# Patient Record
Sex: Male | Born: 1950
Health system: Southern US, Community
[De-identification: ages and names within clinical notes are randomized; demographics above are authoritative.]

## PROBLEM LIST (undated history)

## (undated) DIAGNOSIS — Z889 Allergy status to unspecified drugs, medicaments and biological substances status: Secondary | ICD-10-CM

## (undated) DIAGNOSIS — E119 Type 2 diabetes mellitus without complications: Secondary | ICD-10-CM

## (undated) DIAGNOSIS — R931 Abnormal findings on diagnostic imaging of heart and coronary circulation: Secondary | ICD-10-CM

## (undated) DIAGNOSIS — E785 Hyperlipidemia, unspecified: Secondary | ICD-10-CM

## (undated) DIAGNOSIS — K429 Umbilical hernia without obstruction or gangrene: Secondary | ICD-10-CM

## (undated) DIAGNOSIS — C801 Malignant (primary) neoplasm, unspecified: Secondary | ICD-10-CM

## (undated) DIAGNOSIS — R053 Chronic cough: Secondary | ICD-10-CM

## (undated) DIAGNOSIS — J45909 Unspecified asthma, uncomplicated: Secondary | ICD-10-CM

## (undated) DIAGNOSIS — R05 Cough: Secondary | ICD-10-CM

## (undated) DIAGNOSIS — K4090 Unilateral inguinal hernia, without obstruction or gangrene, not specified as recurrent: Secondary | ICD-10-CM

## (undated) HISTORY — DX: Unilateral inguinal hernia, without obstruction or gangrene, not specified as recurrent: K40.90

## (undated) HISTORY — PX: INGUINAL HERNIA REPAIR: SUR1180

## (undated) HISTORY — DX: Abnormal findings on diagnostic imaging of heart and coronary circulation: R93.1

## (undated) HISTORY — DX: Type 2 diabetes mellitus without complications: E11.9

## (undated) HISTORY — PX: ROBOT ASSISTED LAPAROSCOPIC RADICAL PROSTATECTOMY: SHX5141

## (undated) HISTORY — PX: APPENDECTOMY: SHX54

## (undated) HISTORY — PX: TONSILLECTOMY: SUR1361

## (undated) HISTORY — DX: Umbilical hernia without obstruction or gangrene: K42.9

## (undated) HISTORY — DX: Chronic cough: R05.3

## (undated) HISTORY — DX: Hyperlipidemia, unspecified: E78.5

## (undated) HISTORY — DX: Cough: R05

## (undated) HISTORY — PX: OTHER SURGICAL HISTORY: SHX169

## (undated) HISTORY — DX: Malignant (primary) neoplasm, unspecified: C80.1

---

## 2003-03-31 ENCOUNTER — Other Ambulatory Visit: Payer: Self-pay

## 2005-11-09 ENCOUNTER — Encounter: Payer: Self-pay | Admitting: Pulmonary Disease

## 2006-09-09 ENCOUNTER — Emergency Department (HOSPITAL_COMMUNITY): Admission: EM | Admit: 2006-09-09 | Discharge: 2006-09-09 | Payer: Self-pay | Admitting: Emergency Medicine

## 2006-10-10 ENCOUNTER — Encounter: Payer: Self-pay | Admitting: Pulmonary Disease

## 2006-11-03 ENCOUNTER — Encounter: Payer: Self-pay | Admitting: Pulmonary Disease

## 2006-11-14 ENCOUNTER — Encounter: Payer: Self-pay | Admitting: Pulmonary Disease

## 2007-02-01 ENCOUNTER — Encounter: Payer: Self-pay | Admitting: Pulmonary Disease

## 2007-08-28 ENCOUNTER — Encounter: Payer: Self-pay | Admitting: Pulmonary Disease

## 2007-12-06 ENCOUNTER — Encounter: Payer: Self-pay | Admitting: Pulmonary Disease

## 2008-06-23 ENCOUNTER — Ambulatory Visit: Payer: Self-pay | Admitting: Pulmonary Disease

## 2008-06-23 DIAGNOSIS — R0602 Shortness of breath: Secondary | ICD-10-CM | POA: Insufficient documentation

## 2008-06-23 DIAGNOSIS — E785 Hyperlipidemia, unspecified: Secondary | ICD-10-CM | POA: Insufficient documentation

## 2008-06-23 DIAGNOSIS — J309 Allergic rhinitis, unspecified: Secondary | ICD-10-CM | POA: Insufficient documentation

## 2008-07-17 ENCOUNTER — Ambulatory Visit: Payer: Self-pay | Admitting: Pulmonary Disease

## 2008-08-06 ENCOUNTER — Ambulatory Visit: Payer: Self-pay | Admitting: Pulmonary Disease

## 2009-03-02 ENCOUNTER — Ambulatory Visit: Payer: Self-pay | Admitting: Gastroenterology

## 2009-07-13 ENCOUNTER — Emergency Department (HOSPITAL_COMMUNITY): Admission: EM | Admit: 2009-07-13 | Discharge: 2009-07-13 | Payer: Self-pay | Admitting: Family Medicine

## 2010-08-27 ENCOUNTER — Ambulatory Visit: Payer: Self-pay | Admitting: Urology

## 2010-08-31 ENCOUNTER — Emergency Department: Payer: Self-pay | Admitting: Emergency Medicine

## 2010-09-17 ENCOUNTER — Ambulatory Visit: Payer: Self-pay | Admitting: Surgery

## 2011-10-04 ENCOUNTER — Ambulatory Visit: Payer: Self-pay | Admitting: Surgery

## 2011-10-04 LAB — BASIC METABOLIC PANEL
Creatinine: 0.93 mg/dL (ref 0.60–1.30)
EGFR (African American): >60
Glucose: 86 mg/dL (ref 65–99)
Potassium: 4.2 mmol/L (ref 3.5–5.1)
Sodium: 142 mmol/L (ref 136–145)

## 2011-10-04 LAB — CBC WITH DIFFERENTIAL/PLATELET
Basophil %: 0.9 %
Eosinophil #: 0.1 10*3/uL (ref 0.0–0.7)
Eosinophil %: 1.1 %
HGB: 14.7 g/dL (ref 13.0–18.0)
Lymphocyte %: 19.5 %
Neutrophil %: 67.9 %
RBC: 4.95 10*6/uL (ref 4.40–5.90)

## 2012-01-19 ENCOUNTER — Ambulatory Visit: Payer: Self-pay | Admitting: Surgery

## 2012-01-19 LAB — CBC WITH DIFFERENTIAL/PLATELET
Basophil %: 0.6 %
HGB: 15.7 g/dL (ref 13.0–18.0)
Lymphocyte #: 1.2 10*3/uL (ref 1.0–3.6)
MCH: 30.1 pg (ref 26.0–34.0)
MCHC: 34.4 g/dL (ref 32.0–36.0)
Monocyte #: 1 x10 3/mm (ref 0.2–1.0)
Monocyte %: 16.4 %
Neutrophil #: 3.9 10*3/uL (ref 1.4–6.5)
Neutrophil %: 62 %
RBC: 5.21 10*6/uL (ref 4.40–5.90)
RDW: 13.5 % (ref 11.5–14.5)

## 2012-01-19 LAB — BASIC METABOLIC PANEL
Anion Gap: 8 (ref 7–16)
BUN: 18 mg/dL (ref 7–18)
Calcium, Total: 9.4 mg/dL (ref 8.5–10.1)
Co2: 26 mmol/L (ref 21–32)
Creatinine: 0.98 mg/dL (ref 0.60–1.30)
Glucose: 100 mg/dL — ABNORMAL HIGH (ref 65–99)

## 2012-01-25 ENCOUNTER — Ambulatory Visit: Payer: Self-pay | Admitting: Surgery

## 2013-02-15 ENCOUNTER — Encounter (INDEPENDENT_AMBULATORY_CARE_PROVIDER_SITE_OTHER): Payer: Self-pay

## 2013-02-15 ENCOUNTER — Encounter: Payer: Self-pay | Admitting: Cardiovascular Disease

## 2013-02-15 ENCOUNTER — Ambulatory Visit (INDEPENDENT_AMBULATORY_CARE_PROVIDER_SITE_OTHER): Payer: BC Managed Care – PPO | Admitting: Cardiovascular Disease

## 2013-02-15 VITALS — BP 130/84 | HR 95 | Ht 71.0 in | Wt 233.0 lb

## 2013-02-15 DIAGNOSIS — R079 Chest pain, unspecified: Secondary | ICD-10-CM

## 2013-02-15 NOTE — Progress Notes (Signed)
History of Present Illness: 63 yo male with history of BPH referred today as a new patient for evaluation of chest pain. He has recently been seen in primary care by Dr. Carol Ada. No prior cardiac issues. He describes chest pain that began three weeks ago. He had been painting the day before and then loaded the bikes on the car to go to the beach. He had central chest pressure that lasted for several minutes then resolved. He had recurrence of chest pain that night associated with diaphoresis and dyspnea. The pain lasted for 15-20 minutes. In the last three weeks he has had some tightness in the chest but just mild.  He has a chronic cough which typically flares in the fall of the year. He has seen an allergist and a pulmonologist in [place]. Felt to be allergy related. He had a stress test in  4-5 years  Primary Care Physician: Carol Ada   Past Medical History  Diagnosis Date  . Hyperlipidemia   . Umbilical hernia   . Inguinal hernia   . Chronic cough     Past Surgical History  Procedure Laterality Date  . Inguinal hernia repair Bilateral   . Appendectomy    . Tonsillectomy    . Umbilicial hernia repair      Current Outpatient Prescriptions  Medication Sig Dispense Refill  . cetirizine (ZYRTEC) 5 MG tablet Take 5 mg by mouth daily.      . fluticasone-salmeterol (ADVAIR HFA) 115-21 MCG/ACT inhaler Inhale 2 puffs into the lungs 2 (two) times daily.       No current facility-administered medications for this visit.    Allergies  Allergen Reactions  . Codeine     History   Social History  . Marital Status: Married    Spouse Name: N/A    Number of Children: 2  . Years of Education: N/A   Occupational History  . 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 Smoking status: Never Smoker   . Smokeless tobacco: Not on file  . Alcohol Use: No  . Drug Use: No  . Sexual Activity: Not on file   Other Topics Concern  . Not on file    Social History Narrative  . No narrative on file    Family History  Problem Relation Age of Onset  . CAD Father     CABG with redo  . Heart attack Paternal Grandfather     Review of Systems:  As stated in the HPI and otherwise negative.   BP 130/84  Pulse 95  Ht 5\' 11"  (1.803 m)  Wt 233 lb (105.688 kg)  BMI 32.51 kg/m2  Physical Examination: General: Well developed, well nourished, NAD HEENT: OP clear, mucus membranes moist SKIN: warm, dry. No rashes. Neuro: No focal deficits Musculoskeletal: Muscle strength 5/5 all ext Psychiatric: Mood and affect normal Neck: No JVD, no carotid bruits, no thyromegaly, no lymphadenopathy. Lungs:Clear bilaterally, no wheezes, rhonci, crackles Cardiovascular: Regular rate and rhythm. No murmurs, gallops or rubs. Abdomen:Soft. Bowel sounds present. Non-tender.  Extremities: No lower extremity edema. Pulses are 2 + in the bilateral DP/PT.  EKG: NSR, rate 95 bpm.   Assessment and Plan:   1. Chest pain: Risk factors for CAD include hyperlipidemia and FH of CAD. He has had several recent episodes of chest pain worrisome for unstable angina. Will arrange exercise stress myoview to exclude ischemia. Will arrange echo to assess LVEF, exclude structural heart disease. I  will see him back in several weeks to review.

## 2013-02-15 NOTE — Patient Instructions (Addendum)
Your physician recommends that you schedule a follow-up appointment in:  4-5 weeks  Your physician has requested that you have an echocardiogram. Echocardiography is a painless test that uses sound waves to create images of your heart. It provides your doctor with information about the size and shape of your heart and how well your heart's chambers and valves are working. This procedure takes approximately one hour. There are no restrictions for this procedure.   Your physician has requested that you have an exercise stress myoview. For further information please visit [email or web address]. Please follow instruction sheet, as given.  

## 2013-02-18 ENCOUNTER — Ambulatory Visit (HOSPITAL_COMMUNITY): Payer: BC Managed Care – PPO | Attending: Cardiovascular Disease | Admitting: Radiology

## 2013-02-18 DIAGNOSIS — R072 Precordial pain: Secondary | ICD-10-CM

## 2013-02-18 DIAGNOSIS — R0602 Shortness of breath: Secondary | ICD-10-CM | POA: Insufficient documentation

## 2013-02-18 DIAGNOSIS — E785 Hyperlipidemia, unspecified: Secondary | ICD-10-CM | POA: Insufficient documentation

## 2013-02-18 DIAGNOSIS — R079 Chest pain, unspecified: Secondary | ICD-10-CM

## 2013-02-18 NOTE — Progress Notes (Signed)
Echocardiogram performed.  

## 2013-02-20 ENCOUNTER — Telehealth: Payer: Self-pay | Admitting: Cardiovascular Disease

## 2013-02-20 NOTE — Telephone Encounter (Signed)
Spoke with pt and reviewed echo results with him.  

## 2013-02-20 NOTE — Telephone Encounter (Signed)
New message    Patient calling back to recent phone call from nurse.

## 2013-03-05 ENCOUNTER — Encounter: Payer: Self-pay | Admitting: Cardiology

## 2013-03-05 ENCOUNTER — Ambulatory Visit (HOSPITAL_COMMUNITY): Payer: BC Managed Care – PPO | Attending: Internal Medicine | Admitting: Radiology

## 2013-03-05 VITALS — BP 127/78 | Ht 71.0 in | Wt 231.0 lb

## 2013-03-05 DIAGNOSIS — R0602 Shortness of breath: Secondary | ICD-10-CM | POA: Insufficient documentation

## 2013-03-05 DIAGNOSIS — Z8249 Family history of ischemic heart disease and other diseases of the circulatory system: Secondary | ICD-10-CM | POA: Insufficient documentation

## 2013-03-05 DIAGNOSIS — E785 Hyperlipidemia, unspecified: Secondary | ICD-10-CM | POA: Insufficient documentation

## 2013-03-05 DIAGNOSIS — R079 Chest pain, unspecified: Secondary | ICD-10-CM | POA: Insufficient documentation

## 2013-03-05 MED ORDER — TECHNETIUM TC 99M SESTAMIBI GENERIC - CARDIOLITE
30.0000 | Freq: Once | INTRAVENOUS | Status: AC | PRN
  Administered 2013-03-05: 30 via INTRAVENOUS

## 2013-03-05 MED ORDER — TECHNETIUM TC 99M SESTAMIBI GENERIC - CARDIOLITE
10.0000 | Freq: Once | INTRAVENOUS | Status: AC | PRN
  Administered 2013-03-05: 10 via INTRAVENOUS

## 2013-03-05 NOTE — Progress Notes (Signed)
  [facility] [address] [place],  [address] [phone]    Cardiology Nuclear Med Study  Frank Wells is a 63 y.o. male     MRN : [id number]     DOB: 1950-07-24  Procedure Date: 03/05/2013  Nuclear Med Background Indication for Stress Test:  Evaluation for Ischemia History:  No H/O CAD 6 yrs ago MPI: [place], 02/18/13 ECHO: EF: 55-60% Cardiac Risk Factors: Family History - CAD and Lipids  Symptoms:  Chest Pain, DOE and SOB   Nuclear Pre-Procedure Caffeine/Decaff Intake:  None NPO After: 7:00pm   Lungs:  clear O2 Sat: 96% on room air. IV 0.9% NS with Angio Cath:  20g  IV Site: R Hand  IV Started by:  Matilde Haymaker, RN  Chest Size (in):  44 Cup Size: n/a  Height: 5\' 11"  (1.803 m)  Weight:  231 lb (104.781 kg)  BMI:  Body mass index is 32.23 kg/(m^2). Tech Comments:  n/a    Nuclear Med Study 1 or 2 day study: 1 day  Stress Test Type:  Stress  Reading MD: n/a  Order Authorizing Provider:  Gennette Pac  Resting Radionuclide: Technetium 57m Sestamibi  Resting Radionuclide Dose: 11.0 mCi   Stress Radionuclide:  Technetium 34m Sestamibi  Stress Radionuclide Dose: 33.0 mCi           Stress Protocol Rest HR: 64 Stress HR: 142  Rest BP: 127/78 Stress BP: 178/68  Exercise Time (min): 5:00 METS: 7.0   Predicted Max HR: 159 bpm % Max HR: 89.31 bpm Rate Pressure Product: 25276   Dose of Adenosine (mg):  n/a Dose of Lexiscan: n/a mg  Dose of Atropine (mg): n/a Dose of Dobutamine: n/a mcg/kg/min (at max HR)  Stress Test Technologist: Perrin Maltese, EMT-P  Nuclear Technologist:  Charlton Amor, CNMT     Rest Procedure:  Myocardial perfusion imaging was performed at rest 45 minutes following the intravenous administration of Technetium 47m Sestamibi. Rest ECG: NSR - Normal EKG  Stress Procedure:  The patient exercised on the treadmill utilizing the Bruce Protocol for 5:00 minutes. The patient stopped due to sob and  denied any chest pain.  Technetium 2m Sestamibi was injected at peak exercise and myocardial perfusion imaging was performed after a brief delay. Stress ECG: No significant change from baseline ECG  QPS Raw Data Images:  Normal; no motion artifact; normal heart/lung ratio. Stress Images:  Normal homogeneous uptake in all areas of the myocardium. Rest Images:  Normal homogeneous uptake in all areas of the myocardium. Subtraction (SDS):  No evidence of ischemia. Transient Ischemic Dilatation (Normal <1.22):  0.98 Lung/Heart Ratio (Normal <0.45):  0.32  Quantitative Gated Spect Images QGS EDV:  82 ml QGS ESV:  33 ml  Impression Exercise Capacity:  Good exercise capacity. BP Response:  Normal blood pressure response. Clinical Symptoms:  There is dyspnea. ECG Impression:  No significant ST segment change suggestive of ischemia. Comparison with Prior Nuclear Study: No images to compare  Overall Impression:  Normal stress nuclear study.  LV Ejection Fraction: 60%.  LV Wall Motion:  NL LV Function; NL Wall Motion  Signed: Fransico Him, MD 03/05/2013

## 2013-03-12 ENCOUNTER — Ambulatory Visit (INDEPENDENT_AMBULATORY_CARE_PROVIDER_SITE_OTHER): Payer: BC Managed Care – PPO | Admitting: Cardiovascular Disease

## 2013-03-12 ENCOUNTER — Encounter: Payer: Self-pay | Admitting: Cardiovascular Disease

## 2013-03-12 VITALS — BP 125/67 | HR 81 | Ht 71.0 in | Wt 230.0 lb

## 2013-03-12 DIAGNOSIS — R079 Chest pain, unspecified: Secondary | ICD-10-CM

## 2013-03-12 NOTE — Progress Notes (Signed)
History of Present Illness: 63 yo male with history of BPH, chest pain here today for cardiac follow up. He was seen as a new patient 02/15/13 for evaluation of chest pain. No prior cardiac issues. At the first visit he described chest pain that began three weeks prior to that visit. He had been painting the day before and then loaded the bikes on the car to go to the beach. He had central chest pressure that lasted for several minutes then resolved. He had recurrence of chest pain that night associated with diaphoresis and dyspnea. The pain lasted for 15-20 minutes. There was recurrence of mild chest pain over the next few weeks.  He has a chronic cough which typically flares in the fall of the year. He has seen an allergist and a pulmonologist in [place]. Felt to be allergy related. He had a stress test in [place] 4-5 years ago that was reported as normal.  I arranged an echo and a stress myoview. Echo 02/18/13 with normal LV size and function, no significant valve disease. Stress myoview 03/05/13 with no ischemia.   He is here today for follow up. He has had no further chest pain. He continues to have some dyspnea with exertion. Overall feeling well.   Primary Care Physician: Carol Ada   Past Medical History  Diagnosis Date  . Hyperlipidemia   . Umbilical hernia   . Inguinal hernia   . Chronic cough     Past Surgical History  Procedure Laterality Date  . Inguinal hernia repair Bilateral   . Appendectomy    . Tonsillectomy    . Umbilicial hernia repair      Current Outpatient Prescriptions  Medication Sig Dispense Refill  . cetirizine (ZYRTEC) 5 MG tablet Take 5 mg by mouth daily.      . fluticasone-salmeterol (ADVAIR HFA) 115-21 MCG/ACT inhaler Inhale 2 puffs into the lungs 2 (two) times daily.       No current facility-administered medications for this visit.    Allergies  Allergen Reactions  . Codeine     History   Social History  . Marital Status: Married   Spouse Name: N/A    Number of Children: 2  . Years of Education: N/A   Occupational History  . 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 Smoking status: Never Smoker   . Smokeless tobacco: Not on file  . Alcohol Use: No  . Drug Use: No  . Sexual Activity: Not on file   Other Topics Concern  . Not on file   Social History Narrative  . No narrative on file    Family History  Problem Relation Age of Onset  . CAD Father     CABG with redo  . Heart attack Paternal Grandfather     Review of Systems:  As stated in the HPI and otherwise negative.   BP 125/67  Pulse 81  Ht 5\' 11"  (1.803 m)  Wt 230 lb (104.327 kg)  BMI 32.09 kg/m2  Physical Examination: General: Well developed, well nourished, NAD HEENT: OP clear, mucus membranes moist SKIN: warm, dry. No rashes. Neuro: No focal deficits Musculoskeletal: Muscle strength 5/5 all ext Psychiatric: Mood and affect normal Neck: No JVD, no carotid bruits, no thyromegaly, no lymphadenopathy. Lungs:Clear bilaterally, no wheezes, rhonci, crackles Cardiovascular: Regular rate and rhythm. No murmurs, gallops or rubs. Abdomen:Soft. Bowel sounds present. Non-tender.  Extremities: No lower extremity edema. Pulses are 2 + in the bilateral  DP/PT.  Echo 02/18/13: Left ventricle: The cavity size was normal. Systolic function was normal. The estimated ejection fraction was in the range of 55% to 60%. Wall motion was normal; there were no regional wall motion abnormalities. - Left atrium: The atrium was mildly dilated. - Atrial septum: No defect or patent foramen ovale was identified.  Stress myoview 03/05/13: Stress Procedure: The patient exercised on the treadmill utilizing the Bruce Protocol for 5:00 minutes. The patient stopped due to sob and denied any chest pain. Technetium 76m Sestamibi was injected at peak exercise and myocardial perfusion imaging was performed after a brief delay.  Stress ECG: No significant  change from baseline ECG  QPS  Raw Data Images: Normal; no motion artifact; normal heart/lung ratio.  Stress Images: Normal homogeneous uptake in all areas of the myocardium.  Rest Images: Normal homogeneous uptake in all areas of the myocardium.  Subtraction (SDS): No evidence of ischemia.  Transient Ischemic Dilatation (Normal <1.22): 0.98  Lung/Heart Ratio (Normal <0.45): 0.32  Quantitative Gated Spect Images  QGS EDV: 82 ml  QGS ESV: 33 ml  Impression  Exercise Capacity: Good exercise capacity.  BP Response: Normal blood pressure response.  Clinical Symptoms: There is dyspnea.  ECG Impression: No significant ST segment change suggestive of ischemia.  Comparison with Prior Nuclear Study: No images to compare  Overall Impression: Normal stress nuclear study.  LV Ejection Fraction: 60%. LV Wall Motion: NL LV Function; NL Wall Motion  Assessment and Plan:   1. Chest pain: Stress myoview without ischemia. Echo with normal LV function, no significant valve disease. His chest pain is most likely non-cardiac based on results of testing. Chest pain has resolved but still with some exertional dyspnea. I have encouraged weight loss and exercise. His dyspnea could be related to deconditioning or possibly a reactive airways component to his allergies and cough.

## 2013-03-12 NOTE — Patient Instructions (Addendum)
Your physician recommends that you schedule a follow-up appointment  As needed with Dr. McAlhany  

## 2014-04-25 NOTE — Op Note (Signed)
PATIENT NAME:  Frank, [name] MR#:  [id number] DATE OF BIRTH:  1950-03-17  DATE OF PROCEDURE:  01/25/2012  PREOPERATIVE DIAGNOSES: 1. Recurrent right inguinal hernia.  2. Left inguinal hernia.   POSTOPERATIVE DIAGNOSES:  1. Recurrent right inguinal hernia.  2. Left inguinal hernia.   PROCEDURES PERFORMED: 1. Laparoscopic preperitoneal repair of a left inguinal hernia.  2. Laparoscopic transabdominal repair of a recurrent right inguinal hernia.   SURGEON: Phoebe Perch, M.D.   ASSISTANT: Joie Bimler, PA-S  ANESTHESIA: General with endotracheal tube.   INDICATION: This is a patient with a recurrent right inguinal hernia and a new left inguinal hernia requiring repair. He has had a prior laparoscopic preperitoneal approach to the right inguinal hernia, therefore, balloons and a preperitoneal or a TEPP could not be approached in this right-sided patient, but the left side is a new hernia.   Preoperatively, we discussed rationale for the difference in the approaches, the risks of bleeding, infection, recurrence, ischemic orchitis, chronic pain syndrome and neuroma. This was all reviewed for him and his wife in the preop holding area. They understood and agreed to proceed.   FINDINGS: On the left was a large indirect inguinal hernia repaired via an extraperitoneal approach.   On the right was a large indirect inguinal hernia, recurrence, where the mesh had slipped around and into the large patulous internal ring significant for and consistent with his prior huge neglected indirect inguinal hernia that was repaired previously.   DESCRIPTION OF PROCEDURE: The patient was induced to general anesthesia. A Foley catheter was placed. IV antibiotics were given. VTE prophylaxis was in place. He was prepped and draped in sterile fashion. Marcaine was infiltrated in the skin and subcutaneous tissues around the periumbilical area where a prior umbilical hernia scar was incised and then retraction over  to the left rectus sheath was performed. The left rectus sheath was incised. The muscle retracted laterally. The [place] surgical dissecting balloon was placed followed by the [place] surgical structural balloon and the preperitoneal space was insufflated and under direct vision utilizing his old scars two midline 5 mm ports were placed. Cooper's ligament was delineated. The lateral dissection was determined. The cord was skeletonized of a large indirect sac, which was retracted cephalad. The patulous internal ring was noted. The nerve was identified and kept in view at all times and then into the preperitoneal space was placed a laterally scissored standard Atrium 4 x 6 mesh, which had been laterally scissored. It was held in place with the [place] surgical tacker avoiding the lateral nerve area and once assuring that the hernia was adequately repaired photos were taken and the preperitoneal space was desufflated under direct vision. There was no rent in the peritoneum and no bowel came into contact with the Atrium mesh.   All ports were removed at this point and attention was returned to the left rectus sheath. The muscle was retracted laterally and the peritoneum was elevated, incised and a blunt trocar was placed into the abdominal cavity and the abdominal cavity was insufflated. A 10 mm camera was placed and the abdominal cavity was explored. There was no sign of bowel injury. The left inguinal hernia, that had been repaired preperitoneally, was photographed. The peritoneum was intact over the preperitoneal space, on the left side.   On the right side, the recurrent hernia was identified, multiple adhesions were identified. Under direct vision, a 5 mm left lateral port was placed in order to place the Harmonic scalpel. Adhesions were taken  down with the Harmonic scalpel to visualize the recurrent hernia. Then under direct vision another 5 mm port was placed, in the right upper quadrant, for a working port. These two ports  were utilized to then dissect out the peritoneum and identify the hernia sac, which was reduced. Omentum was present within the hernia initially, which was reduced, and further dissection revealed the mesh had invaginated into the large patulous ring.   Identification of the recurrence lateral to the vessels was performed. A small bleeder was identified and doubly clipped at the edge of the mesh. This was probably a portion of the cord vessels. The cord was not dissected completely out except right at the hernia sac which had been reduced.   It was determined that a repair technique unique to this situation would be required. Therefore, utilizing an Atrium ProLoop mesh, a polypropylene monofilament mesh cone was inserted into the abdominal cavity and placed into the internal ring along side of the cord. This adequately filled the rather patulous internal ring where the recurrence had occurred.   In order to cover this ProLoop mesh, a piece of C-QUR, from Atrium, was trimmed to size, 7 x 7 cm, after measuring the expected space and clearing Cooper's ligament. This was placed in the abdominal cavity and then held in with tacks which covered the area of recurrence from the Cooper's ligament up above the existing mesh and lateral. Tacks were utilized on the anterior abdominal wall and to Cooper's ligament, but not laterally to avoid the area of the nerve.  Of note, the patient was in steep Trendelenburg position at this point and there was adequate tail of this unstapled mesh along the iliac vessels to allow for fixation once he was taken out of Trendelenburg position without the use of staples. This was done by direct visualization and by removing him from Trendelenburg position to a neutral position. The omentum rolled over this mesh and the mesh was checked and tested and remained in position quite well adequately covering the recurrent hernia. Photos were again taken. There was no sign of bowel coming into  contact with the mesh at all, but the mesh was a coated mesh adequately covering the Pro-Loop Prolene mesh.   The area was again checked for hemostasis. There was no sign of bowel injury or further adhesions. Therefore, pneumoperitoneum was released, all ports were removed, fascial edges were approximated with figure-of-eight 0 Vicryls and then 4-0 subcuticular Monocryl was used at all of the incisions. He was taken to the recovery room in stable condition to be discharged in the care of his family. A Foley catheter will be sent home with a leg bag because of this patient's prior history of urinary retention following his TEPP done 2 years ago. ____________________________ Frank Wells. Burt Knack, MD rec:sb D: 01/25/2012 10:24:29 ET T: 01/25/2012 11:42:12 ET JOB#: [id number]  cc: Frank Wells. Burt Knack, MD, <Dictator> Florene Glen MD ELECTRONICALLY SIGNED 01/25/2012 14:36

## 2014-04-25 NOTE — H&P (Signed)
PATIENT NAME:  Frank Wells, Frank Wells MR#:  [id number] DATE OF BIRTH:  1950-10-07  DATE OF ADMISSION:  01/25/2012  CHIEF COMPLAINT: Bilateral inguinal hernias.   HISTORY OF PRESENT ILLNESS: This is a 64 year old Caucasian male patient with a history of a prior right inguinal hernia and umbilical hernia which was repaired. The right was repaired laparoscopically via a preperitoneal approach and has recurred. He now has a symptomatic recurrent right inguinal hernia and a left inguinal hernia. He is here for elective laparoscopic transabdominal repair of both hernias.   PAST MEDICAL HISTORY: Bronchitis, recent prednisone use.   PAST SURGICAL HISTORY: Umbilical hernia and inguinal hernia repairs.   ALLERGIES: CODEINE.   SOCIAL HISTORY: The patient does not smoke or drink.   REVIEW OF SYSTEMS: Ten-system review has been performed and negative with the exception of that mentioned in the HPI.  It is documented in the office chart.   FAMILY HISTORY: Noncontributory.   PHYSICAL EXAMINATION: GENERAL: Healthy, obese male patient.  HEENT: No scleral icterus.  NECK: No palpable neck nodes.  CHEST: Clear to auscultation.  CARDIAC: Regular rate and rhythm.  ABDOMEN: Soft. Scars are noted. Nontender.  EXTREMITIES: Without edema. Calves are nontender.  NEUROLOGIC: Grossly intact.  INTEGUMENT: No jaundice.  GENITOURINARY: Bilateral inguinal hernias, the right is recurrent, with normal testicle,  ASSESSMENT AND PLAN: This is a patient with bilateral inguinal hernias, the right is recurrent. He is here for elective laparoscopic repair utilizing a transabdominal approach. The rationale for this has been discussed. The options of observation have been reviewed; and the risks of bleeding, infection, recurrence, ischemic orchitis, chronic pain syndrome, and neuroma have all been reviewed with him. He understood and agreed to proceed.   ____________________________ Jerrol Banana Burt Knack, MD rec:cb D: 01/24/2012  16:47:08 ET T: 01/24/2012 17:59:46 ET JOB#: [id number]  cc: Jerrol Banana. Burt Knack, MD, <Dictator> Florene Glen MD ELECTRONICALLY SIGNED 01/25/2012 14:35

## 2014-09-07 ENCOUNTER — Encounter: Payer: Self-pay | Admitting: Adult Health

## 2014-09-07 ENCOUNTER — Emergency Department: Payer: BLUE CROSS/BLUE SHIELD

## 2014-09-07 ENCOUNTER — Emergency Department
Admission: EM | Admit: 2014-09-07 | Discharge: 2014-09-07 | Disposition: A | Discharge status: Home / Self Care | Payer: BLUE CROSS/BLUE SHIELD | Attending: Emergency Medicine | Admitting: Emergency Medicine

## 2014-09-07 DIAGNOSIS — I714 Abdominal aortic aneurysm, without rupture, unspecified: Secondary | ICD-10-CM

## 2014-09-07 DIAGNOSIS — Z79899 Other long term (current) drug therapy: Secondary | ICD-10-CM | POA: Insufficient documentation

## 2014-09-07 DIAGNOSIS — R109 Unspecified abdominal pain: Secondary | ICD-10-CM | POA: Diagnosis present

## 2014-09-07 DIAGNOSIS — N23 Unspecified renal colic: Secondary | ICD-10-CM | POA: Diagnosis not present

## 2014-09-07 DIAGNOSIS — Z7951 Long term (current) use of inhaled steroids: Secondary | ICD-10-CM | POA: Diagnosis not present

## 2014-09-07 LAB — COMPREHENSIVE METABOLIC PANEL
ALBUMIN: 4 g/dL (ref 3.5–5.0)
ALT: 25 U/L (ref 17–63)
ANION GAP: 9 (ref 5–15)
AST: 28 U/L (ref 15–41)
Alkaline Phosphatase: 76 U/L (ref 38–126)
BUN: 22 mg/dL — ABNORMAL HIGH (ref 6–20)
CHLORIDE: 105 mmol/L (ref 101–111)
CO2: 25 mmol/L (ref 22–32)
Calcium: 9.6 mg/dL (ref 8.9–10.3)
Creatinine, Ser: 1.32 mg/dL — ABNORMAL HIGH (ref 0.61–1.24)
GFR calc non Af Amer: 56 mL/min — ABNORMAL LOW (ref >60)
GLUCOSE: 178 mg/dL — AB (ref 65–99)
Potassium: 4.4 mmol/L (ref 3.5–5.1)
SODIUM: 139 mmol/L (ref 135–145)
Total Bilirubin: 0.5 mg/dL (ref 0.3–1.2)
Total Protein: 7 g/dL (ref 6.5–8.1)

## 2014-09-07 LAB — URINALYSIS COMPLETE WITH MICROSCOPIC (ARMC ONLY)
BACTERIA UA: NONE SEEN
Bilirubin Urine: NEGATIVE
Glucose, UA: NEGATIVE mg/dL
Ketones, ur: NEGATIVE mg/dL
Leukocytes, UA: NEGATIVE
Nitrite: NEGATIVE
PROTEIN: NEGATIVE mg/dL
SPECIFIC GRAVITY, URINE: 1.03 (ref 1.005–1.030)
SQUAMOUS EPITHELIAL / LPF: NONE SEEN
pH: 7 (ref 5.0–8.0)

## 2014-09-07 LAB — CBC WITH DIFFERENTIAL/PLATELET
Basophils Absolute: 0.1 10*3/uL (ref 0–0.1)
Basophils Relative: 1 %
Eosinophils Absolute: 0.1 10*3/uL (ref 0–0.7)
Eosinophils Relative: 0 %
HEMATOCRIT: 45 % (ref 40.0–52.0)
HEMOGLOBIN: 15 g/dL (ref 13.0–18.0)
LYMPHS ABS: 1.5 10*3/uL (ref 1.0–3.6)
LYMPHS PCT: 11 %
MCH: 29.5 pg (ref 26.0–34.0)
MCHC: 33.3 g/dL (ref 32.0–36.0)
MCV: 88.3 fL (ref 80.0–100.0)
MONOS PCT: 7 %
Monocytes Absolute: 1.1 10*3/uL — ABNORMAL HIGH (ref 0.2–1.0)
NEUTROS ABS: 11.6 10*3/uL — AB (ref 1.4–6.5)
NEUTROS PCT: 81 %
Platelets: 261 10*3/uL (ref 150–440)
RBC: 5.09 MIL/uL (ref 4.40–5.90)
RDW: 13.7 % (ref 11.5–14.5)
WBC: 14.4 10*3/uL — AB (ref 3.8–10.6)

## 2014-09-07 LAB — LIPASE, BLOOD: Lipase: 17 U/L — ABNORMAL LOW (ref 22–51)

## 2014-09-07 MED ORDER — HYDROMORPHONE HCL 1 MG/ML IJ SOLN
INTRAMUSCULAR | Status: AC
  Administered 2014-09-07: 1 mg via INTRAVENOUS
  Filled 2014-09-07: qty 1

## 2014-09-07 MED ORDER — IOHEXOL 350 MG/ML SOLN
100.0000 mL | Freq: Once | INTRAVENOUS | Status: AC | PRN
  Administered 2014-09-07: 100 mL via INTRAVENOUS

## 2014-09-07 MED ORDER — HYDROMORPHONE HCL 1 MG/ML IJ SOLN
0.5000 mg | Freq: Once | INTRAMUSCULAR | Status: AC
  Administered 2014-09-07: 0.5 mg via INTRAVENOUS
  Administered 2014-09-07: 1 mg via INTRAVENOUS

## 2014-09-07 MED ORDER — HYDROMORPHONE HCL 1 MG/ML IJ SOLN
INTRAMUSCULAR | Status: AC
  Filled 2014-09-07: qty 1

## 2014-09-07 MED ORDER — ONDANSETRON 4 MG PO TBDP
ORAL_TABLET | ORAL | Status: AC
  Filled 2014-09-07: qty 1

## 2014-09-07 MED ORDER — ONDANSETRON 4 MG PO TBDP
4.0000 mg | ORAL_TABLET | Freq: Once | ORAL | Status: AC
Start: 2014-09-07 — End: 2014-09-07
  Administered 2014-09-07: 4 mg via ORAL

## 2014-09-07 NOTE — ED Provider Notes (Signed)
[facility] Emergency Department Provider Note  ____________________________________________  Time seen: Approximately 3:36 PM  I have reviewed the triage vital signs and the nursing notes.   HISTORY  Chief Complaint Groin Pain    HPI Frank Wells is a 64 y.o. male who had sudden onset of severe left-sided groin pain today. He had no previous episodes of pain. EMS came to pick him up and reportedly he was pale and clammy. They thought they felt a pulsatile mass in his left groin. On arrival in the emergency room patient was in extreme distress pale and sweaty. Ultrasound of his abdomen did not reveal any sign of the aorta due to his large abdomen and multiple gas-filled loops of bowel. Ultrasound of both inguinal areas did not reveal any obvious aneurysm. I could not help 8 and a pulsatile mass myself. Patient had a stat CT of the abdomen contrast. This showed a 2 mm stone in the left distal ureter. The pain was severe although it improved with Tylenol. Sharp and crampy in nature there were new. No new medicines or activities nothing made the pain better or worse   Past Medical History  Diagnosis Date  . Hyperlipidemia   . Umbilical hernia   . Inguinal hernia   . Chronic cough     Patient Active Problem List   Diagnosis Date Noted  . HYPERLIPIDEMIA 06/23/2008  . ALLERGIC RHINITIS 06/23/2008  . DYSPNEA 06/23/2008    Past Surgical History  Procedure Laterality Date  . Inguinal hernia repair Bilateral   . Appendectomy    . Tonsillectomy    . Umbilicial hernia repair      Current Outpatient Rx  Name  Route  Sig  Dispense  Refill  . cetirizine (ZYRTEC) 5 MG tablet   Oral   Take 5 mg by mouth daily.         . fluticasone-salmeterol (ADVAIR HFA) 115-21 MCG/ACT inhaler   Inhalation   Inhale 2 puffs into the lungs 2 (two) times daily.           Allergies Codeine  Family History  Problem Relation Age of Onset  . CAD Father     CABG with  redo  . Heart attack Paternal Grandfather     Social History Social History  Substance Use Topics  . Smoking status: Never Smoker   . Smokeless tobacco: None  . Alcohol Use: No    Review of Systems Constitutional: No fever/chills Eyes: No visual changes. ENT: No sore throat. Cardiovascular: Denies chest pain. Respiratory: Denies shortness of breath. Gastrointestinal: See history of present illness Genitourinary: Negative for dysuria! Musculoskeletal: Negative for back pain. Skin: Negative for rash. Neurological: Negative for headaches, focal weakness or numbness.  10-point ROS otherwise negative.  ____________________________________________   PHYSICAL EXAM:  VITAL SIGNS: ED Triage Vitals  Enc Vitals Group     BP 09/07/14 1330 122/68 mmHg     Pulse Rate 09/07/14 1330 68     Resp 09/07/14 1330 11     Temp 09/07/14 1347 98.1 F (36.7 C)     Temp Source 09/07/14 1347 Oral     SpO2 09/07/14 1330 100 %     Weight --      Height --      Head Cir --      Peak Flow --      Pain Score 09/07/14 1347 10     Pain Loc --      Pain Edu? --  Excl. in GC? --     Constitutional: Patient writhing in pain and asking his wife was not present to call Dr. Joaquin Courts: Conjunctivae are normal. PERRL. EOMI. Head: Atraumatic. Nose: No congestion/rhinnorhea. Mouth/Throat: Mucous membranes are moist.  Oropharynx non-erythematous. Neck: No stridor.  Cardiovascular: Normal rate, regular rhythm. Grossly normal heart sounds.  Good peripheral circulation. Respiratory: Normal respiratory effort.  No retractions. Lungs CTAB. Gastrointestinal: Soft and nontender except for in the lower abdomen which is markedly tender.. No distention. No abdominal bruits. No CVA tenderness. Genitourinary tract: Normal circumcised male normal testicles. Musculoskeletal: No lower extremity tenderness nor edema.  No joint effusions. Neurologic:  Normal speech and language. No gross focal neurologic deficits are  appreciated.  Skin:  Skin is warm, dry and intact. No rash noted. Psychiatric: Mood and affect are normal. Speech and behavior are normal.  ____________________________________________   LABS (all labs ordered are listed, but only abnormal results are displayed)  Labs Reviewed  COMPREHENSIVE METABOLIC PANEL - Abnormal; Notable for the following:    Glucose, Bld 178 (*)    BUN 22 (*)    Creatinine, Ser 1.32 (*)    GFR calc non Af Amer 56 (*)    All other components within normal limits  LIPASE, BLOOD - Abnormal; Notable for the following:    Lipase 17 (*)    All other components within normal limits  CBC WITH DIFFERENTIAL/PLATELET - Abnormal; Notable for the following:    WBC 14.4 (*)    Neutro Abs 11.6 (*)    Monocytes Absolute 1.1 (*)    All other components within normal limits  URINALYSIS COMPLETEWITH MICROSCOPIC ([facility] ONLY) - Abnormal; Notable for the following:    Color, Urine STRAW (*)    APPearance CLEAR (*)    Hgb urine dipstick 2+ (*)    All other components within normal limits   ____________________________________________  EKG  EKG read and interpreted by me shows normal sinus rhythm at a rate of 82 normal axis no acute ST-T wave changes there are couple segments very poor baseline but there does not appear to be anything going on there ____________________________________________  RADIOLOGY  CT read by radiologist as 2 mm wash obstructing stone in the distal left ureter. The prostate is also nodular ____________________________________________   PROCEDURES    ____________________________________________   INITIAL IMPRESSION / ASSESSMENT AND PLAN / ED COURSE  Pertinent labs & imaging results that were available during my care of the patient were reviewed by me and considered in my medical decision making (see chart for details).   ____________________________________________   FINAL CLINICAL IMPRESSION(S) / ED DIAGNOSES  Final diagnosis is  renal colic    Nena Polio, MD 09/07/14 2337

## 2014-09-07 NOTE — ED Notes (Signed)
Pt was taken to CT with RN accompanyment

## 2014-09-07 NOTE — ED Notes (Signed)
Presents from home with severe abdominal and groin pain-came in by EMS-per wife pt was having a normal day and playing with grandchildren, began having severe pain and was found in bathroom on floor moaning and unable to get up-pt brought in by ems, iv started, bp obtained on both arms-left arm 138/83 and right arm 136/71, bilateral lower leg warm, bilateral feet cool to touch with cyanosis noted to bilateral toes. Wife states he was coherent at first-pt is not currently coherent and repeats self often

## 2015-04-15 DIAGNOSIS — Z6832 Body mass index (BMI) 32.0-32.9, adult: Secondary | ICD-10-CM | POA: Diagnosis not present

## 2015-04-15 DIAGNOSIS — R972 Elevated prostate specific antigen [PSA]: Secondary | ICD-10-CM | POA: Diagnosis not present

## 2015-05-06 DIAGNOSIS — C61 Malignant neoplasm of prostate: Secondary | ICD-10-CM | POA: Diagnosis not present

## 2015-05-06 DIAGNOSIS — R972 Elevated prostate specific antigen [PSA]: Secondary | ICD-10-CM | POA: Diagnosis not present

## 2015-05-26 DIAGNOSIS — J45909 Unspecified asthma, uncomplicated: Secondary | ICD-10-CM | POA: Diagnosis not present

## 2015-06-08 DIAGNOSIS — R972 Elevated prostate specific antigen [PSA]: Secondary | ICD-10-CM | POA: Diagnosis not present

## 2015-06-08 DIAGNOSIS — C61 Malignant neoplasm of prostate: Secondary | ICD-10-CM | POA: Diagnosis not present

## 2015-06-08 DIAGNOSIS — Z6832 Body mass index (BMI) 32.0-32.9, adult: Secondary | ICD-10-CM | POA: Diagnosis not present

## 2015-07-08 DIAGNOSIS — G8929 Other chronic pain: Secondary | ICD-10-CM | POA: Diagnosis not present

## 2015-07-08 DIAGNOSIS — C639 Malignant neoplasm of male genital organ, unspecified: Secondary | ICD-10-CM | POA: Diagnosis not present

## 2015-07-08 DIAGNOSIS — C61 Malignant neoplasm of prostate: Secondary | ICD-10-CM | POA: Diagnosis not present

## 2015-07-08 DIAGNOSIS — R1031 Right lower quadrant pain: Secondary | ICD-10-CM | POA: Diagnosis not present

## 2015-07-08 DIAGNOSIS — Z01818 Encounter for other preprocedural examination: Secondary | ICD-10-CM | POA: Diagnosis not present

## 2015-07-08 DIAGNOSIS — R0989 Other specified symptoms and signs involving the circulatory and respiratory systems: Secondary | ICD-10-CM | POA: Diagnosis not present

## 2015-07-08 DIAGNOSIS — J9811 Atelectasis: Secondary | ICD-10-CM | POA: Diagnosis not present

## 2015-07-27 DIAGNOSIS — Z6832 Body mass index (BMI) 32.0-32.9, adult: Secondary | ICD-10-CM | POA: Diagnosis not present

## 2015-07-27 DIAGNOSIS — K660 Peritoneal adhesions (postprocedural) (postinfection): Secondary | ICD-10-CM | POA: Diagnosis not present

## 2015-07-27 DIAGNOSIS — E669 Obesity, unspecified: Secondary | ICD-10-CM | POA: Diagnosis not present

## 2015-07-27 DIAGNOSIS — Z87442 Personal history of urinary calculi: Secondary | ICD-10-CM | POA: Diagnosis not present

## 2015-07-27 DIAGNOSIS — Z885 Allergy status to narcotic agent status: Secondary | ICD-10-CM | POA: Diagnosis not present

## 2015-07-27 DIAGNOSIS — C61 Malignant neoplasm of prostate: Secondary | ICD-10-CM | POA: Diagnosis not present

## 2015-07-28 DIAGNOSIS — C61 Malignant neoplasm of prostate: Secondary | ICD-10-CM | POA: Diagnosis not present

## 2015-08-05 DIAGNOSIS — C61 Malignant neoplasm of prostate: Secondary | ICD-10-CM | POA: Diagnosis not present

## 2015-10-19 DIAGNOSIS — H9201 Otalgia, right ear: Secondary | ICD-10-CM | POA: Diagnosis not present

## 2015-10-19 DIAGNOSIS — H6501 Acute serous otitis media, right ear: Secondary | ICD-10-CM | POA: Diagnosis not present

## 2015-11-18 DIAGNOSIS — J4520 Mild intermittent asthma, uncomplicated: Secondary | ICD-10-CM | POA: Diagnosis not present

## 2015-11-18 DIAGNOSIS — J310 Chronic rhinitis: Secondary | ICD-10-CM | POA: Diagnosis not present

## 2015-11-19 DIAGNOSIS — H5213 Myopia, bilateral: Secondary | ICD-10-CM | POA: Diagnosis not present

## 2015-11-19 DIAGNOSIS — H43313 Vitreous membranes and strands, bilateral: Secondary | ICD-10-CM | POA: Diagnosis not present

## 2015-12-14 DIAGNOSIS — H43813 Vitreous degeneration, bilateral: Secondary | ICD-10-CM | POA: Diagnosis not present

## 2015-12-16 DIAGNOSIS — Z885 Allergy status to narcotic agent status: Secondary | ICD-10-CM | POA: Diagnosis not present

## 2015-12-16 DIAGNOSIS — C61 Malignant neoplasm of prostate: Secondary | ICD-10-CM | POA: Diagnosis not present

## 2015-12-16 DIAGNOSIS — Z6831 Body mass index (BMI) 31.0-31.9, adult: Secondary | ICD-10-CM | POA: Diagnosis not present

## 2015-12-30 DIAGNOSIS — H33321 Round hole, right eye: Secondary | ICD-10-CM | POA: Diagnosis not present

## 2016-03-02 DIAGNOSIS — C61 Malignant neoplasm of prostate: Secondary | ICD-10-CM | POA: Diagnosis not present

## 2016-03-09 DIAGNOSIS — N5231 Erectile dysfunction following radical prostatectomy: Secondary | ICD-10-CM | POA: Diagnosis not present

## 2016-03-09 DIAGNOSIS — C61 Malignant neoplasm of prostate: Secondary | ICD-10-CM | POA: Diagnosis not present

## 2016-03-09 DIAGNOSIS — R3981 Functional urinary incontinence: Secondary | ICD-10-CM | POA: Diagnosis not present

## 2016-03-09 DIAGNOSIS — Z6831 Body mass index (BMI) 31.0-31.9, adult: Secondary | ICD-10-CM | POA: Diagnosis not present

## 2016-05-04 DIAGNOSIS — J301 Allergic rhinitis due to pollen: Secondary | ICD-10-CM | POA: Diagnosis not present

## 2016-05-17 DIAGNOSIS — R0609 Other forms of dyspnea: Secondary | ICD-10-CM | POA: Diagnosis not present

## 2016-05-17 DIAGNOSIS — J310 Chronic rhinitis: Secondary | ICD-10-CM | POA: Diagnosis not present

## 2016-05-17 DIAGNOSIS — J45909 Unspecified asthma, uncomplicated: Secondary | ICD-10-CM | POA: Diagnosis not present

## 2016-06-08 DIAGNOSIS — C61 Malignant neoplasm of prostate: Secondary | ICD-10-CM | POA: Diagnosis not present

## 2016-06-15 DIAGNOSIS — C61 Malignant neoplasm of prostate: Secondary | ICD-10-CM | POA: Diagnosis not present

## 2016-07-18 DIAGNOSIS — Z0000 Encounter for general adult medical examination without abnormal findings: Secondary | ICD-10-CM | POA: Diagnosis not present

## 2016-07-18 DIAGNOSIS — Z13220 Encounter for screening for lipoid disorders: Secondary | ICD-10-CM | POA: Diagnosis not present

## 2016-07-18 DIAGNOSIS — R7303 Prediabetes: Secondary | ICD-10-CM | POA: Diagnosis not present

## 2016-08-01 DIAGNOSIS — R3981 Functional urinary incontinence: Secondary | ICD-10-CM | POA: Diagnosis not present

## 2016-08-12 DIAGNOSIS — R3981 Functional urinary incontinence: Secondary | ICD-10-CM | POA: Diagnosis not present

## 2016-08-15 DIAGNOSIS — R3981 Functional urinary incontinence: Secondary | ICD-10-CM | POA: Diagnosis not present

## 2016-08-26 DIAGNOSIS — R3981 Functional urinary incontinence: Secondary | ICD-10-CM | POA: Diagnosis not present

## 2016-08-29 DIAGNOSIS — R3981 Functional urinary incontinence: Secondary | ICD-10-CM | POA: Diagnosis not present

## 2016-09-21 DIAGNOSIS — J310 Chronic rhinitis: Secondary | ICD-10-CM | POA: Diagnosis not present

## 2016-09-21 DIAGNOSIS — R0789 Other chest pain: Secondary | ICD-10-CM | POA: Diagnosis not present

## 2016-09-21 DIAGNOSIS — J9811 Atelectasis: Secondary | ICD-10-CM | POA: Diagnosis not present

## 2016-09-21 DIAGNOSIS — R05 Cough: Secondary | ICD-10-CM | POA: Diagnosis not present

## 2016-09-26 DIAGNOSIS — R3981 Functional urinary incontinence: Secondary | ICD-10-CM | POA: Diagnosis not present

## 2016-09-26 DIAGNOSIS — M6289 Other specified disorders of muscle: Secondary | ICD-10-CM | POA: Diagnosis not present

## 2016-09-26 DIAGNOSIS — R252 Cramp and spasm: Secondary | ICD-10-CM | POA: Diagnosis not present

## 2016-09-29 ENCOUNTER — Other Ambulatory Visit: Payer: Self-pay | Admitting: Specialist

## 2016-09-29 DIAGNOSIS — R1312 Dysphagia, oropharyngeal phase: Secondary | ICD-10-CM

## 2016-10-05 DIAGNOSIS — C61 Malignant neoplasm of prostate: Secondary | ICD-10-CM | POA: Diagnosis not present

## 2016-10-12 DIAGNOSIS — Z6832 Body mass index (BMI) 32.0-32.9, adult: Secondary | ICD-10-CM | POA: Diagnosis not present

## 2016-10-12 DIAGNOSIS — C61 Malignant neoplasm of prostate: Secondary | ICD-10-CM | POA: Diagnosis not present

## 2016-10-17 DIAGNOSIS — M7918 Myalgia, other site: Secondary | ICD-10-CM | POA: Diagnosis not present

## 2016-10-27 DIAGNOSIS — J0100 Acute maxillary sinusitis, unspecified: Secondary | ICD-10-CM | POA: Diagnosis not present

## 2016-11-09 DIAGNOSIS — J45909 Unspecified asthma, uncomplicated: Secondary | ICD-10-CM | POA: Diagnosis not present

## 2016-11-09 DIAGNOSIS — R0609 Other forms of dyspnea: Secondary | ICD-10-CM | POA: Diagnosis not present

## 2016-11-09 DIAGNOSIS — J4521 Mild intermittent asthma with (acute) exacerbation: Secondary | ICD-10-CM | POA: Diagnosis not present

## 2016-11-17 DIAGNOSIS — J45909 Unspecified asthma, uncomplicated: Secondary | ICD-10-CM | POA: Diagnosis not present

## 2016-11-17 DIAGNOSIS — J209 Acute bronchitis, unspecified: Secondary | ICD-10-CM | POA: Diagnosis not present

## 2016-12-07 DIAGNOSIS — C441192 Basal cell carcinoma of skin of left lower eyelid, including canthus: Secondary | ICD-10-CM | POA: Diagnosis not present

## 2016-12-07 DIAGNOSIS — L218 Other seborrheic dermatitis: Secondary | ICD-10-CM | POA: Diagnosis not present

## 2016-12-07 DIAGNOSIS — L738 Other specified follicular disorders: Secondary | ICD-10-CM | POA: Diagnosis not present

## 2016-12-07 DIAGNOSIS — L820 Inflamed seborrheic keratosis: Secondary | ICD-10-CM | POA: Diagnosis not present

## 2016-12-07 DIAGNOSIS — C44111 Basal cell carcinoma of skin of unspecified eyelid, including canthus: Secondary | ICD-10-CM | POA: Diagnosis not present

## 2016-12-22 DIAGNOSIS — J4530 Mild persistent asthma, uncomplicated: Secondary | ICD-10-CM | POA: Diagnosis not present

## 2016-12-22 DIAGNOSIS — J301 Allergic rhinitis due to pollen: Secondary | ICD-10-CM | POA: Diagnosis not present

## 2017-01-20 DIAGNOSIS — C61 Malignant neoplasm of prostate: Secondary | ICD-10-CM | POA: Diagnosis not present

## 2017-01-25 DIAGNOSIS — C61 Malignant neoplasm of prostate: Secondary | ICD-10-CM | POA: Diagnosis not present

## 2017-01-25 DIAGNOSIS — Z6832 Body mass index (BMI) 32.0-32.9, adult: Secondary | ICD-10-CM | POA: Diagnosis not present

## 2017-01-30 DIAGNOSIS — L814 Other melanin hyperpigmentation: Secondary | ICD-10-CM | POA: Diagnosis not present

## 2017-01-30 DIAGNOSIS — L578 Other skin changes due to chronic exposure to nonionizing radiation: Secondary | ICD-10-CM | POA: Diagnosis not present

## 2017-01-30 DIAGNOSIS — C44111 Basal cell carcinoma of skin of unspecified eyelid, including canthus: Secondary | ICD-10-CM | POA: Diagnosis not present

## 2017-01-30 DIAGNOSIS — L821 Other seborrheic keratosis: Secondary | ICD-10-CM | POA: Diagnosis not present

## 2017-04-20 DIAGNOSIS — C61 Malignant neoplasm of prostate: Secondary | ICD-10-CM | POA: Diagnosis not present

## 2017-04-26 DIAGNOSIS — Z6832 Body mass index (BMI) 32.0-32.9, adult: Secondary | ICD-10-CM | POA: Diagnosis not present

## 2017-04-26 DIAGNOSIS — C61 Malignant neoplasm of prostate: Secondary | ICD-10-CM | POA: Diagnosis not present

## 2017-07-25 DIAGNOSIS — E7800 Pure hypercholesterolemia, unspecified: Secondary | ICD-10-CM | POA: Diagnosis not present

## 2017-07-25 DIAGNOSIS — R7303 Prediabetes: Secondary | ICD-10-CM | POA: Diagnosis not present

## 2017-07-25 DIAGNOSIS — Z0000 Encounter for general adult medical examination without abnormal findings: Secondary | ICD-10-CM | POA: Diagnosis not present

## 2017-07-25 DIAGNOSIS — Z23 Encounter for immunization: Secondary | ICD-10-CM | POA: Diagnosis not present

## 2017-07-25 DIAGNOSIS — M5416 Radiculopathy, lumbar region: Secondary | ICD-10-CM | POA: Diagnosis not present

## 2017-07-26 ENCOUNTER — Ambulatory Visit
Admission: RE | Admit: 2017-07-26 | Discharge: 2017-07-26 | Disposition: A | Discharge status: Home / Self Care | Payer: BLUE CROSS/BLUE SHIELD | Source: Ambulatory Visit | Attending: Family Medicine | Admitting: Family Medicine

## 2017-07-26 ENCOUNTER — Other Ambulatory Visit: Payer: Self-pay | Admitting: Family Medicine

## 2017-07-26 DIAGNOSIS — M47816 Spondylosis without myelopathy or radiculopathy, lumbar region: Secondary | ICD-10-CM | POA: Diagnosis not present

## 2017-07-26 DIAGNOSIS — M5416 Radiculopathy, lumbar region: Secondary | ICD-10-CM

## 2017-07-31 DIAGNOSIS — E119 Type 2 diabetes mellitus without complications: Secondary | ICD-10-CM | POA: Diagnosis not present

## 2017-07-31 DIAGNOSIS — E7800 Pure hypercholesterolemia, unspecified: Secondary | ICD-10-CM | POA: Diagnosis not present

## 2017-08-15 DIAGNOSIS — M545 Low back pain: Secondary | ICD-10-CM | POA: Diagnosis not present

## 2017-08-15 DIAGNOSIS — M5136 Other intervertebral disc degeneration, lumbar region: Secondary | ICD-10-CM | POA: Diagnosis not present

## 2017-08-21 DIAGNOSIS — M5136 Other intervertebral disc degeneration, lumbar region: Secondary | ICD-10-CM | POA: Diagnosis not present

## 2017-08-21 DIAGNOSIS — M545 Low back pain: Secondary | ICD-10-CM | POA: Diagnosis not present

## 2017-08-22 DIAGNOSIS — Z86010 Personal history of colonic polyps: Secondary | ICD-10-CM | POA: Diagnosis not present

## 2017-08-23 DIAGNOSIS — M545 Low back pain: Secondary | ICD-10-CM | POA: Diagnosis not present

## 2017-08-23 DIAGNOSIS — M5136 Other intervertebral disc degeneration, lumbar region: Secondary | ICD-10-CM | POA: Diagnosis not present

## 2017-08-30 DIAGNOSIS — M545 Low back pain: Secondary | ICD-10-CM | POA: Diagnosis not present

## 2017-08-30 DIAGNOSIS — C61 Malignant neoplasm of prostate: Secondary | ICD-10-CM | POA: Diagnosis not present

## 2017-08-30 DIAGNOSIS — M5136 Other intervertebral disc degeneration, lumbar region: Secondary | ICD-10-CM | POA: Diagnosis not present

## 2017-09-06 DIAGNOSIS — C61 Malignant neoplasm of prostate: Secondary | ICD-10-CM | POA: Diagnosis not present

## 2017-09-06 DIAGNOSIS — Z6830 Body mass index (BMI) 30.0-30.9, adult: Secondary | ICD-10-CM | POA: Diagnosis not present

## 2017-09-12 DIAGNOSIS — E7800 Pure hypercholesterolemia, unspecified: Secondary | ICD-10-CM | POA: Diagnosis not present

## 2017-10-05 ENCOUNTER — Encounter: Payer: Self-pay | Admitting: Dietician

## 2017-10-05 ENCOUNTER — Encounter: Payer: BLUE CROSS/BLUE SHIELD | Attending: Family Medicine | Admitting: Dietician

## 2017-10-05 DIAGNOSIS — E119 Type 2 diabetes mellitus without complications: Secondary | ICD-10-CM | POA: Diagnosis not present

## 2017-10-05 DIAGNOSIS — Z713 Dietary counseling and surveillance: Secondary | ICD-10-CM | POA: Diagnosis not present

## 2017-10-05 DIAGNOSIS — E7800 Pure hypercholesterolemia, unspecified: Secondary | ICD-10-CM | POA: Insufficient documentation

## 2017-10-05 DIAGNOSIS — Z8546 Personal history of malignant neoplasm of prostate: Secondary | ICD-10-CM | POA: Insufficient documentation

## 2017-10-05 DIAGNOSIS — Z885 Allergy status to narcotic agent status: Secondary | ICD-10-CM | POA: Insufficient documentation

## 2017-10-05 DIAGNOSIS — Z79899 Other long term (current) drug therapy: Secondary | ICD-10-CM | POA: Diagnosis not present

## 2017-10-05 NOTE — Progress Notes (Signed)
Patient was seen on 10/05/17 for the first of a series of three diabetes self-management courses at the [facility].  Patient Education Plan per assessed needs and concerns is to attend three course education program for Diabetes Self Management Education.  The following learning objectives were met by the patient during this class:  Describe diabetes  State some common risk factors for diabetes  Defines the role of glucose and insulin  Identifies type of diabetes and pathophysiology  Describe the relationship between diabetes and cardiovascular risk  State the members of the Healthcare Team  States the rationale for glucose monitoring  State when to test glucose  State their individual Target Range  State the importance of logging glucose readings  Describe how to interpret glucose readings  Identifies A1C target  Explain the correlation between A1c and eAG values  State symptoms and treatment of high blood glucose  State symptoms and treatment of low blood glucose  Explain proper technique for glucose testing  Identifies proper sharps disposal  Handouts given during class include:  ADA Diabetes You Take Control   Carb Counting and Meal Planning book  Meal Plan Card  Meal planning worksheet  Low Sodium Flavoring Tips  Types of Fats  The diabetes portion plate  D5H to eAG Conversion Chart  Diabetes Recommended Care Schedule  Support Group  Diabetes Success Plan  Core Class Satisfaction Survey   Follow-Up Plan:  Attend core 2

## 2017-10-12 ENCOUNTER — Encounter: Payer: BLUE CROSS/BLUE SHIELD | Admitting: Dietician

## 2017-10-12 ENCOUNTER — Encounter: Payer: Self-pay | Admitting: Dietician

## 2017-10-12 DIAGNOSIS — Z713 Dietary counseling and surveillance: Secondary | ICD-10-CM | POA: Diagnosis not present

## 2017-10-12 DIAGNOSIS — E119 Type 2 diabetes mellitus without complications: Secondary | ICD-10-CM

## 2017-10-12 DIAGNOSIS — E7800 Pure hypercholesterolemia, unspecified: Secondary | ICD-10-CM | POA: Diagnosis not present

## 2017-10-12 DIAGNOSIS — Z79899 Other long term (current) drug therapy: Secondary | ICD-10-CM | POA: Diagnosis not present

## 2017-10-12 DIAGNOSIS — Z885 Allergy status to narcotic agent status: Secondary | ICD-10-CM | POA: Diagnosis not present

## 2017-10-12 DIAGNOSIS — Z8546 Personal history of malignant neoplasm of prostate: Secondary | ICD-10-CM | POA: Diagnosis not present

## 2017-10-12 NOTE — Progress Notes (Signed)
Patient was seen on 10/12/17 for the second of a series of three diabetes self-management courses at the [facility]. The following learning objectives were met by the patient during this class:   Describe the role of different macronutrients on glucose  Explain how carbohydrates affect blood glucose  State what foods contain the most carbohydrates  Demonstrate carbohydrate counting  Demonstrate how to read Nutrition Facts food label  Describe effects of various fats on heart health  Describe the importance of good nutrition for health and healthy eating strategies  Describe techniques for managing your shopping, cooking and meal planning  List strategies to follow meal plan when dining out  Describe the effects of alcohol on glucose and how to use it safely  Goals:  Follow Diabetes Meal Plan as instructed  Aim to spread carbs evenly throughout the day  Aim for 3 meals per day and snacks as needed Include lean protein foods to meals/snacks  Monitor glucose levels as instructed by your doctor   Follow-Up Plan:  Attend Core 3  Work towards following your personal food plan.   

## 2017-10-19 ENCOUNTER — Encounter: Payer: Self-pay | Admitting: Dietician

## 2017-10-19 ENCOUNTER — Encounter: Payer: BLUE CROSS/BLUE SHIELD | Admitting: Dietician

## 2017-10-19 DIAGNOSIS — E119 Type 2 diabetes mellitus without complications: Secondary | ICD-10-CM

## 2017-10-19 DIAGNOSIS — Z713 Dietary counseling and surveillance: Secondary | ICD-10-CM | POA: Diagnosis not present

## 2017-10-19 DIAGNOSIS — E7800 Pure hypercholesterolemia, unspecified: Secondary | ICD-10-CM | POA: Diagnosis not present

## 2017-10-19 DIAGNOSIS — Z79899 Other long term (current) drug therapy: Secondary | ICD-10-CM | POA: Diagnosis not present

## 2017-10-19 DIAGNOSIS — Z8546 Personal history of malignant neoplasm of prostate: Secondary | ICD-10-CM | POA: Diagnosis not present

## 2017-10-19 DIAGNOSIS — Z885 Allergy status to narcotic agent status: Secondary | ICD-10-CM | POA: Diagnosis not present

## 2017-10-19 NOTE — Progress Notes (Signed)
Patient was seen on 10/19/17 for the third of a series of three diabetes self-management courses at the [facility].   Frank Wells the amount of activity recommended for healthy living . Describe activities suitable for individual needs . Identify ways to regularly incorporate activity into daily life . Identify barriers to activity and ways to over come these barriers  Identify diabetes medications being personally used and their primary action for lowering glucose and possible side effects . Describe role of stress on blood glucose and develop strategies to address psychosocial issues . Identify diabetes complications and ways to prevent them  Explain how to manage diabetes during illness . Evaluate success in meeting personal goal . Establish 2-3 goals that they will plan to diligently work on  Goals:   I will count my carb choices at most meals and snacks  I will be active 30 minutes or more 5 times a week  Your patient has identified these potential barriers to change:  Stress  Your patient has identified their diabetes self-care support plan as   American Diabetes Association Website    Plan:  Attend Support Group as desired

## 2017-10-27 DIAGNOSIS — R7303 Prediabetes: Secondary | ICD-10-CM | POA: Diagnosis not present

## 2017-10-27 DIAGNOSIS — E7800 Pure hypercholesterolemia, unspecified: Secondary | ICD-10-CM | POA: Diagnosis not present

## 2017-11-01 DIAGNOSIS — Z23 Encounter for immunization: Secondary | ICD-10-CM | POA: Diagnosis not present

## 2017-11-01 DIAGNOSIS — E119 Type 2 diabetes mellitus without complications: Secondary | ICD-10-CM | POA: Diagnosis not present

## 2017-11-01 DIAGNOSIS — R0602 Shortness of breath: Secondary | ICD-10-CM | POA: Diagnosis not present

## 2017-11-09 ENCOUNTER — Encounter: Payer: Self-pay | Admitting: *Deleted

## 2017-11-10 ENCOUNTER — Ambulatory Visit: Payer: BLUE CROSS/BLUE SHIELD | Admitting: Anesthesiology

## 2017-11-10 ENCOUNTER — Encounter
Admission: RE | Disposition: A | Discharge status: Home / Self Care | Payer: Self-pay | Source: Ambulatory Visit | Attending: Unknown Physician Specialty

## 2017-11-10 ENCOUNTER — Encounter: Payer: Self-pay | Admitting: Anesthesiology

## 2017-11-10 ENCOUNTER — Ambulatory Visit
Admission: RE | Admit: 2017-11-10 | Discharge: 2017-11-10 | Disposition: A | Discharge status: Home / Self Care | Payer: BLUE CROSS/BLUE SHIELD | Source: Ambulatory Visit | Attending: Unknown Physician Specialty | Admitting: Unknown Physician Specialty

## 2017-11-10 DIAGNOSIS — E119 Type 2 diabetes mellitus without complications: Secondary | ICD-10-CM | POA: Insufficient documentation

## 2017-11-10 DIAGNOSIS — D123 Benign neoplasm of transverse colon: Secondary | ICD-10-CM | POA: Diagnosis not present

## 2017-11-10 DIAGNOSIS — E785 Hyperlipidemia, unspecified: Secondary | ICD-10-CM | POA: Diagnosis not present

## 2017-11-10 DIAGNOSIS — Z86010 Personal history of colonic polyps: Secondary | ICD-10-CM | POA: Diagnosis not present

## 2017-11-10 DIAGNOSIS — Z1211 Encounter for screening for malignant neoplasm of colon: Secondary | ICD-10-CM | POA: Diagnosis not present

## 2017-11-10 DIAGNOSIS — K648 Other hemorrhoids: Secondary | ICD-10-CM | POA: Diagnosis not present

## 2017-11-10 DIAGNOSIS — K640 First degree hemorrhoids: Secondary | ICD-10-CM | POA: Diagnosis not present

## 2017-11-10 DIAGNOSIS — Z79899 Other long term (current) drug therapy: Secondary | ICD-10-CM | POA: Diagnosis not present

## 2017-11-10 DIAGNOSIS — K5730 Diverticulosis of large intestine without perforation or abscess without bleeding: Secondary | ICD-10-CM | POA: Diagnosis not present

## 2017-11-10 DIAGNOSIS — D126 Benign neoplasm of colon, unspecified: Secondary | ICD-10-CM | POA: Diagnosis not present

## 2017-11-10 DIAGNOSIS — K5790 Diverticulosis of intestine, part unspecified, without perforation or abscess without bleeding: Secondary | ICD-10-CM | POA: Diagnosis not present

## 2017-11-10 DIAGNOSIS — K5791 Diverticulosis of intestine, part unspecified, without perforation or abscess with bleeding: Secondary | ICD-10-CM | POA: Diagnosis not present

## 2017-11-10 DIAGNOSIS — K635 Polyp of colon: Secondary | ICD-10-CM | POA: Diagnosis not present

## 2017-11-10 HISTORY — PX: COLONOSCOPY WITH PROPOFOL: SHX5780

## 2017-11-10 SURGERY — COLONOSCOPY WITH PROPOFOL
Anesthesia: General

## 2017-11-10 MED ORDER — LIDOCAINE HCL (PF) 2 % IJ SOLN
INTRAMUSCULAR | Status: DC | PRN
  Administered 2017-11-10: 80 mg

## 2017-11-10 MED ORDER — SODIUM CHLORIDE 0.9 % IV SOLN
INTRAVENOUS | Status: DC
  Administered 2017-11-10: 1000 mL via INTRAVENOUS

## 2017-11-10 MED ORDER — PROPOFOL 500 MG/50ML IV EMUL
INTRAVENOUS | Status: DC | PRN
  Administered 2017-11-10: 50 ug/kg/min via INTRAVENOUS

## 2017-11-10 MED ORDER — LIDOCAINE HCL (PF) 2 % IJ SOLN
INTRAMUSCULAR | Status: AC
  Filled 2017-11-10: qty 10

## 2017-11-10 MED ORDER — FENTANYL CITRATE (PF) 100 MCG/2ML IJ SOLN
INTRAMUSCULAR | Status: DC | PRN
  Administered 2017-11-10 (×2): 50 ug via INTRAVENOUS

## 2017-11-10 MED ORDER — PROPOFOL 10 MG/ML IV BOLUS
INTRAVENOUS | Status: DC | PRN
  Administered 2017-11-10: 30 mg via INTRAVENOUS
  Administered 2017-11-10: 20 mg via INTRAVENOUS

## 2017-11-10 MED ORDER — MIDAZOLAM HCL 5 MG/5ML IJ SOLN
INTRAMUSCULAR | Status: DC | PRN
  Administered 2017-11-10: 2 mg via INTRAVENOUS

## 2017-11-10 MED ORDER — FENTANYL CITRATE (PF) 100 MCG/2ML IJ SOLN
INTRAMUSCULAR | Status: AC
  Filled 2017-11-10: qty 2

## 2017-11-10 MED ORDER — EPHEDRINE SULFATE 50 MG/ML IJ SOLN
INTRAMUSCULAR | Status: DC | PRN
  Administered 2017-11-10 (×2): 5 mg via INTRAVENOUS

## 2017-11-10 MED ORDER — MIDAZOLAM HCL 2 MG/2ML IJ SOLN
INTRAMUSCULAR | Status: AC
  Filled 2017-11-10: qty 2

## 2017-11-10 NOTE — Op Note (Signed)
[facility] Gastroenterology Patient Name: Frank Wells Procedure Date: 11/10/2017 9:59 AM MRN: [id number] Account #: [id number] Date of Birth: 07-10-1950 Admit Type: Outpatient Age: 67 Room: [facility] ENDO ROOM 1 Gender: Male Note Status: Finalized Procedure:            Colonoscopy Indications:          High risk colon cancer surveillance: Personal history                        of colonic polyps Providers:            Manya Silvas, MD Referring MD:         Irven Easterly. Kary Kos, MD (Referring MD) Medicines:            Propofol per Anesthesia Complications:        No immediate complications. Procedure:            Pre-Anesthesia Assessment:                       - After reviewing the risks and benefits, the patient                        was deemed in satisfactory condition to undergo the                        procedure.                       After obtaining informed consent, the colonoscope was                        passed under direct vision. Throughout the procedure,                        the patient's blood pressure, pulse, and oxygen                        saturations were monitored continuously. The                        Colonoscope was introduced through the anus and                        advanced to the the cecum, identified by appendiceal                        orifice and ileocecal valve. The colonoscopy was                        performed without difficulty. The patient tolerated the                        procedure well. The quality of the bowel preparation                        was good. Findings:      A small polyp was found in the hepatic flexure. The polyp was sessile.       The polyp was removed with a hot snare. Resection and retrieval were       complete.      A small polyp was found  in the hepatic flexure. The polyp was sessile.       The polyp was removed with a hot snare. Resection and retrieval were       complete. To prevent bleeding  after the polypectomy, one hemostatic clip       was successfully placed. There was no bleeding at the end of the       procedure.      Two sessile polyps were found in the transverse colon. The polyps were       diminutive in size. These polyps were removed with a jumbo cold forceps.       Resection and retrieval were complete.      A small polyp was found in the ascending colon. The polyp was sessile.       The polyp was removed with a hot snare. Resection was complete, but the       polyp tissue was not retrieved.      Multiple medium-mouthed diverticula were found in the sigmoid colon,       descending colon, transverse colon and ascending colon.      Internal hemorrhoids were found during endoscopy. The hemorrhoids were       small, medium-sized and Grade I (internal hemorrhoids that do not       prolapse).      The exam was otherwise without abnormality. Impression:           - One small polyp at the hepatic flexure, removed with                        a hot snare. Resected and retrieved.                       - One small polyp at the hepatic flexure, removed with                        a hot snare. Resected and retrieved. Clip was placed.                       - Two diminutive polyps in the transverse colon,                        removed with a jumbo cold forceps. Resected and                        retrieved.                       - One small polyp in the ascending colon, removed with                        a hot snare. Complete resection. Polyp tissue not                        retrieved.                       - Diverticulosis in the sigmoid colon, in the                        descending colon, in the transverse colon and in the  ascending colon.                       - Internal hemorrhoids.                       - The examination was otherwise normal. Recommendation:       - Await pathology results. Manya Silvas, MD 11/10/2017 10:38:00 AM This  report has been signed electronically. Number of Addenda: 0 Note Initiated On: 11/10/2017 9:59 AM Scope Withdrawal Time: 0 hours 19 minutes 21 seconds  Total Procedure Duration: 0 hours 28 minutes 20 seconds       [facility]

## 2017-11-10 NOTE — Anesthesia Post-op Follow-up Note (Signed)
Anesthesia QCDR form completed.        

## 2017-11-10 NOTE — H&P (Signed)
Primary Care Physician:  Maryland Pink, MD Primary Gastroenterologist:  Dr. Vira Agar  Pre-Procedure History & Physical: HPI:  Frank Wells is a 67 y.o. male is here for an colonoscopy.   Past Medical History:  Diagnosis Date  . Cancer [facility])    Prostate  . Chronic cough   . Diabetes mellitus without complication (HCC)    diet controlled  . Hyperlipidemia   . Inguinal hernia   . Umbilical hernia     Past Surgical History:  Procedure Laterality Date  . APPENDECTOMY    . INGUINAL HERNIA REPAIR Bilateral   . TONSILLECTOMY    . umbilicial hernia repair      Prior to Admission medications   Medication Sig Start Date End Date Taking? Authorizing Provider  albuterol (PROVENTIL HFA;VENTOLIN HFA) 108 (90 Base) MCG/ACT inhaler Inhale 2 puffs into the lungs every 6 (six) hours as needed for wheezing or shortness of breath.   Yes [provider]  rosuvastatin (CRESTOR) 10 MG tablet Take 10 mg by mouth daily.   Yes [provider]  cetirizine (ZYRTEC) 5 MG tablet Take 5 mg by mouth daily.    [provider]  fluticasone-salmeterol (ADVAIR HFA) 115-21 MCG/ACT inhaler Inhale 2 puffs into the lungs 2 (two) times daily.    [provider]    Allergies as of 08/22/2017 - Review Complete 09/07/2014  Allergen Reaction Noted  . Codeine  06/23/2008    Family History  Problem Relation Age of Onset  . CAD Father        CABG with redo  . Heart attack Paternal Grandfather     Social History   Socioeconomic History  . Marital status: Married    Spouse name: Not on file  . Number of children: 2  . Years of education: Not on file  . Highest education level: Not on file  Occupational History  . Occupation: Conservation officer, nature at [employer]: [place]  . Financial resource strain: Not on file  . Food insecurity:    Worry: Not on file    Inability: Not on file  . Transportation needs:    Medical: Not on file    Non-medical: Not on file   Tobacco Use  . Smoking status: Never Smoker  . Smokeless tobacco: Never Used  Substance and Sexual Activity  . Alcohol use: No  . Drug use: No  . Sexual activity: Not on file  Lifestyle  . Physical activity:    Days per week: Not on file    Minutes per session: Not on file  . Stress: Not on file  Relationships  . Social connections:    Talks on phone: Not on file    Gets together: Not on file    Attends religious service: Not on file    Active member of club or organization: Not on file    Attends meetings of clubs or organizations: Not on file    Relationship status: Not on file  . Intimate partner violence:    Fear of current or ex partner: Not on file    Emotionally abused: Not on file    Physically abused: Not on file    Forced sexual activity: Not on file  Other Topics Concern  . Not on file  Social History Narrative  . Not on file    Review of Systems: See HPI, otherwise negative ROS  Physical Exam: BP 119/61   Pulse 84   Temp (!) 97.2  F (36.2 C) (Tympanic)   Resp 15   Ht 5\' 10"  (1.778 m)   Wt 89.4 kg   SpO2 99%   BMI 28.27 kg/m  General:   Alert,  pleasant and cooperative in NAD Head:  Normocephalic and atraumatic. Neck:  Supple; no masses or thyromegaly. Lungs:  Clear throughout to auscultation.    Heart:  Regular rate and rhythm. Abdomen:  Soft, nontender and nondistended. Normal bowel sounds, without guarding, and without rebound.   Neurologic:  Alert and  oriented x4;  grossly normal neurologically.  Impression/Plan: Frank Wells is here for an colonoscopy to be performed for [facility] colon polyps.  Risks, benefits, limitations, and alternatives regarding  colonoscopy have been reviewed with the patient.  Questions have been answered.  All parties agreeable.   Gaylyn Cheers, MD  11/10/2017, 10:43 AM

## 2017-11-10 NOTE — Anesthesia Postprocedure Evaluation (Signed)
Anesthesia Post Note  Patient: Frank Wells  Procedure(s) Performed: COLONOSCOPY WITH PROPOFOL (N/A )  Patient location during evaluation: Endoscopy Anesthesia Type: General Level of consciousness: awake and alert Pain management: pain level controlled Vital Signs Assessment: post-procedure vital signs reviewed and stable Respiratory status: spontaneous breathing, nonlabored ventilation, respiratory function stable and patient connected to nasal cannula oxygen Cardiovascular status: blood pressure returned to baseline and stable Postop Assessment: no apparent nausea or vomiting Anesthetic complications: no     Last Vitals:  Vitals:   11/10/17 1100 11/10/17 1110  BP: 109/61 120/60  Pulse: 74 69  Resp: 16 12  Temp:    SpO2: 100% 99%    Last Pain:  Vitals:   11/10/17 1030  TempSrc: Tympanic  PainSc:                  Rashelle Ireland S

## 2017-11-10 NOTE — Anesthesia Preprocedure Evaluation (Signed)
Anesthesia Evaluation  Patient identified by MRN, date of birth, ID band Patient awake    Reviewed: Allergy & Precautions, NPO status , Patient's Chart, lab work & pertinent test results, reviewed documented beta blocker date and time   Airway Mallampati: III  TM Distance: >3 FB     Dental  (+) Chipped   Pulmonary shortness of breath,           Cardiovascular      Neuro/Psych    GI/Hepatic   Endo/Other  diabetes, Type 2  Renal/GU      Musculoskeletal   Abdominal   Peds  Hematology   Anesthesia Other Findings Obese. EF 55-60 4 yrs ago.  Reproductive/Obstetrics                             Anesthesia Physical Anesthesia Plan  ASA: III  Anesthesia Plan: General   Post-op Pain Management:    Induction: Intravenous  PONV Risk Score and Plan:   Airway Management Planned:   Additional Equipment:   Intra-op Plan:   Post-operative Plan:   Informed Consent: I have reviewed the patients History and Physical, chart, labs and discussed the procedure including the risks, benefits and alternatives for the proposed anesthesia with the patient or authorized representative who has indicated his/her understanding and acceptance.     Plan Discussed with: CRNA  Anesthesia Plan Comments:         Anesthesia Quick Evaluation

## 2017-11-10 NOTE — Transfer of Care (Signed)
Immediate Anesthesia Transfer of Care Note  Patient: Frank Wells  Procedure(s) Performed: COLONOSCOPY WITH PROPOFOL (N/A )  Patient Location: PACU  Anesthesia Type:General  Level of Consciousness: sedated  Airway & Oxygen Therapy: Patient Spontanous Breathing and Patient connected to nasal cannula oxygen  Post-op Assessment: Report given to RN and Post -op Vital signs reviewed and stable  Post vital signs: Reviewed and stable  Last Vitals:  Vitals Value Taken Time  BP    Temp    Pulse 87 11/10/2017 10:38 AM  Resp 16 11/10/2017 10:38 AM  SpO2 97 % 11/10/2017 10:38 AM  Vitals shown include unvalidated device data.  Last Pain:  Vitals:   11/10/17 0940  TempSrc: Tympanic  PainSc: 0-No pain         Complications: No apparent anesthesia complications

## 2017-11-13 ENCOUNTER — Encounter: Payer: Self-pay | Admitting: Unknown Physician Specialty

## 2017-11-13 LAB — SURGICAL PATHOLOGY

## 2017-11-28 DIAGNOSIS — L814 Other melanin hyperpigmentation: Secondary | ICD-10-CM | POA: Diagnosis not present

## 2017-11-28 DIAGNOSIS — D485 Neoplasm of uncertain behavior of skin: Secondary | ICD-10-CM | POA: Diagnosis not present

## 2017-11-28 DIAGNOSIS — Z85828 Personal history of other malignant neoplasm of skin: Secondary | ICD-10-CM | POA: Diagnosis not present

## 2017-11-28 DIAGNOSIS — L821 Other seborrheic keratosis: Secondary | ICD-10-CM | POA: Diagnosis not present

## 2017-12-04 DIAGNOSIS — Z4889 Encounter for other specified surgical aftercare: Secondary | ICD-10-CM | POA: Diagnosis not present

## 2018-02-07 DIAGNOSIS — M546 Pain in thoracic spine: Secondary | ICD-10-CM | POA: Diagnosis not present

## 2018-02-07 DIAGNOSIS — M62830 Muscle spasm of back: Secondary | ICD-10-CM | POA: Diagnosis not present

## 2018-02-07 DIAGNOSIS — R208 Other disturbances of skin sensation: Secondary | ICD-10-CM | POA: Diagnosis not present

## 2018-03-08 DIAGNOSIS — R69 Illness, unspecified: Secondary | ICD-10-CM | POA: Diagnosis not present

## 2018-05-07 DIAGNOSIS — E7800 Pure hypercholesterolemia, unspecified: Secondary | ICD-10-CM | POA: Diagnosis not present

## 2018-05-07 DIAGNOSIS — E119 Type 2 diabetes mellitus without complications: Secondary | ICD-10-CM | POA: Diagnosis not present

## 2018-08-08 DIAGNOSIS — Z23 Encounter for immunization: Secondary | ICD-10-CM | POA: Diagnosis not present

## 2018-08-08 DIAGNOSIS — R1031 Right lower quadrant pain: Secondary | ICD-10-CM | POA: Diagnosis not present

## 2018-08-08 DIAGNOSIS — Z8546 Personal history of malignant neoplasm of prostate: Secondary | ICD-10-CM | POA: Diagnosis not present

## 2018-08-08 DIAGNOSIS — Z1389 Encounter for screening for other disorder: Secondary | ICD-10-CM | POA: Diagnosis not present

## 2018-08-08 DIAGNOSIS — Z0000 Encounter for general adult medical examination without abnormal findings: Secondary | ICD-10-CM | POA: Diagnosis not present

## 2018-08-08 DIAGNOSIS — E7800 Pure hypercholesterolemia, unspecified: Secondary | ICD-10-CM | POA: Diagnosis not present

## 2018-08-08 DIAGNOSIS — E119 Type 2 diabetes mellitus without complications: Secondary | ICD-10-CM | POA: Diagnosis not present

## 2018-09-11 DIAGNOSIS — M5431 Sciatica, right side: Secondary | ICD-10-CM | POA: Diagnosis not present

## 2018-09-19 DIAGNOSIS — R69 Illness, unspecified: Secondary | ICD-10-CM | POA: Diagnosis not present

## 2018-10-04 DIAGNOSIS — Z23 Encounter for immunization: Secondary | ICD-10-CM | POA: Diagnosis not present

## 2018-12-06 DIAGNOSIS — Z125 Encounter for screening for malignant neoplasm of prostate: Secondary | ICD-10-CM | POA: Diagnosis not present

## 2018-12-12 DIAGNOSIS — R972 Elevated prostate specific antigen [PSA]: Secondary | ICD-10-CM | POA: Diagnosis not present

## 2018-12-12 DIAGNOSIS — Z125 Encounter for screening for malignant neoplasm of prostate: Secondary | ICD-10-CM | POA: Diagnosis not present

## 2019-01-30 ENCOUNTER — Telehealth: Payer: Self-pay | Admitting: Nurse Practitioner

## 2019-01-30 ENCOUNTER — Other Ambulatory Visit: Payer: Self-pay | Admitting: Nurse Practitioner

## 2019-01-30 DIAGNOSIS — U071 COVID-19: Secondary | ICD-10-CM | POA: Diagnosis not present

## 2019-01-30 DIAGNOSIS — R05 Cough: Secondary | ICD-10-CM | POA: Diagnosis not present

## 2019-01-30 DIAGNOSIS — R0981 Nasal congestion: Secondary | ICD-10-CM | POA: Diagnosis not present

## 2019-01-30 DIAGNOSIS — B349 Viral infection, unspecified: Secondary | ICD-10-CM | POA: Diagnosis not present

## 2019-01-30 DIAGNOSIS — E119 Type 2 diabetes mellitus without complications: Secondary | ICD-10-CM

## 2019-01-30 NOTE — Progress Notes (Signed)
  I connected by phone with Frank Wells on 01/30/2019 at 7:30 PM to discuss the potential use of an new treatment for mild to moderate COVID-19 viral infection in non-hospitalized patients.  This patient is a 69 y.o. male that meets the FDA criteria for Emergency Use Authorization of bamlanivimab or casirivimab\imdevimab.  Has a (+) direct SARS-CoV-2 viral test result  Has mild or moderate COVID-19   Is ? 67 years of age and weighs ? 40 kg  Is NOT hospitalized due to COVID-19  Is NOT requiring oxygen therapy or requiring an increase in baseline oxygen flow rate due to COVID-19  Is within 10 days of symptom onset  Has at least one of the high risk factor(s) for progression to severe COVID-19 and/or hospitalization as defined in EUA.  Specific high risk criteria : Diabetes   I have spoken and communicated the following to the patient or parent/caregiver:  1. FDA has authorized the emergency use of bamlanivimab and casirivimab\imdevimab for the treatment of mild to moderate COVID-19 in adults and pediatric patients with positive results of direct SARS-CoV-2 viral testing who are 38 years of age and older weighing at least 40 kg, and who are at high risk for progressing to severe COVID-19 and/or hospitalization.  2. The significant known and potential risks and benefits of bamlanivimab and casirivimab\imdevimab, and the extent to which such potential risks and benefits are unknown.  3. Information on available alternative treatments and the risks and benefits of those alternatives, including clinical trials.  4. Patients treated with bamlanivimab and casirivimab\imdevimab should continue to self-isolate and use infection control measures (e.g., wear mask, isolate, social distance, avoid sharing personal items, clean and disinfect "high touch" surfaces, and frequent handwashing) according to CDC guidelines.   5. The patient or parent/caregiver has the option to accept or refuse  bamlanivimab or casirivimab\imdevimab .  After reviewing this information with the patient, The patient agreed to proceed with receiving the bamlanimivab infusion and will be provided a copy of the Fact sheet prior to receiving the infusion.   Lexine Baton Pickenpack-Cousar 01/30/2019 7:30 PM

## 2019-01-30 NOTE — Telephone Encounter (Signed)
Called to discuss with Frank Wells about Covid symptoms and the use of bamlanivimab, a monoclonal antibody infusion for those with mild to moderate Covid symptoms and at a high risk of hospitalization.     Pt is qualified for this infusion at the [facility] infusion center due to co-morbid conditions (diabetes) and/or a member of an at-risk group.   Patient verbalized request to receive infusion. He has been scheduled for Friday 02/01/19 @ 1030. Frank Wells verbalized understanding of treatment and appointment details.      Patient Active Problem List   Diagnosis Date Noted  . HYPERLIPIDEMIA 06/23/2008  . ALLERGIC RHINITIS 06/23/2008  . DYSPNEA 06/23/2008    Alda Lea, AGPCNP-BC Pager: [phone] Amion: Bjorn Pippin

## 2019-02-01 ENCOUNTER — Encounter (HOSPITAL_COMMUNITY): Payer: Self-pay

## 2019-02-01 ENCOUNTER — Ambulatory Visit (HOSPITAL_COMMUNITY)
Admission: RE | Admit: 2019-02-01 | Discharge: 2019-02-01 | Disposition: A | Discharge status: Home / Self Care | Payer: Medicare Other | Source: Ambulatory Visit | Attending: Pulmonary Disease | Admitting: Pulmonary Disease

## 2019-02-01 DIAGNOSIS — U071 COVID-19: Secondary | ICD-10-CM | POA: Diagnosis present

## 2019-02-01 DIAGNOSIS — Z23 Encounter for immunization: Secondary | ICD-10-CM | POA: Insufficient documentation

## 2019-02-01 DIAGNOSIS — E119 Type 2 diabetes mellitus without complications: Secondary | ICD-10-CM

## 2019-02-01 MED ORDER — SODIUM CHLORIDE 0.9 % IV SOLN
700.0000 mg | Freq: Once | INTRAVENOUS | Status: AC
  Administered 2019-02-01: 700 mg via INTRAVENOUS
  Filled 2019-02-01: qty 20

## 2019-02-01 MED ORDER — ALBUTEROL SULFATE HFA 108 (90 BASE) MCG/ACT IN AERS: 2.0000 | INHALATION_SPRAY | Freq: Once | RESPIRATORY_TRACT | Status: DC | PRN

## 2019-02-01 MED ORDER — METHYLPREDNISOLONE SODIUM SUCC 125 MG IJ SOLR: 125.0000 mg | Freq: Once | INTRAMUSCULAR | Status: DC | PRN

## 2019-02-01 MED ORDER — FAMOTIDINE IN NACL 20-0.9 MG/50ML-% IV SOLN: 20.0000 mg | Freq: Once | INTRAVENOUS | Status: DC | PRN

## 2019-02-01 MED ORDER — DIPHENHYDRAMINE HCL 50 MG/ML IJ SOLN: 50.0000 mg | Freq: Once | INTRAMUSCULAR | Status: DC | PRN

## 2019-02-01 MED ORDER — EPINEPHRINE 0.3 MG/0.3ML IJ SOAJ: 0.3000 mg | Freq: Once | INTRAMUSCULAR | Status: DC | PRN

## 2019-02-01 MED ORDER — SODIUM CHLORIDE 0.9 % IV SOLN
INTRAVENOUS | Status: DC | PRN
  Administered 2019-02-01: 11:00:00 250 mL via INTRAVENOUS

## 2019-02-01 NOTE — Progress Notes (Signed)
  Diagnosis: COVID-19  Physician: Dr. Joya Gaskins  Procedure: Covid Infusion Clinic Med: bamlanivimab infusion - Provided patient with bamlanimivab fact sheet for patients, parents and caregivers prior to infusion.  Complications: No immediate complications noted.  Discharge: Discharged home   Frank Wells 02/01/2019

## 2019-02-01 NOTE — Discharge Instructions (Signed)
10 Things You Can Do to Manage Your COVID-19 Symptoms at Home If you have possible or confirmed COVID-19: 1. Stay home from work and school. And stay away from other public places. If you must go out, avoid using any kind of public transportation, ridesharing, or taxis. 2. Monitor your symptoms carefully. If your symptoms get worse, call your healthcare provider immediately. 3. Get rest and stay hydrated. 4. If you have a medical appointment, call the healthcare provider ahead of time and tell them that you have or may have COVID-19. 5. For medical emergencies, call 911 and notify the dispatch personnel that you have or may have COVID-19. 6. Cover your cough and sneezes with a tissue or use the inside of your elbow. 7. Wash your hands often with soap and water for at least 20 seconds or clean your hands with an alcohol-based hand sanitizer that contains at least 60% alcohol. 8. As much as possible, stay in a specific room and away from other people in your home. Also, you should use a separate bathroom, if available. If you need to be around other people in or outside of the home, wear a mask. 9. Avoid sharing personal items with other people in your household, like dishes, towels, and bedding. 10. Clean all surfaces that are touched often, like counters, tabletops, and doorknobs. Use household cleaning sprays or wipes according to the label instructions. cdc.gov/coronavirus 07/04/2018 This information is not intended to replace advice given to you by your health care provider. Make sure you discuss any questions you have with your health care provider. Document Revised: 12/06/2018 Document Reviewed: 12/06/2018 Elsevier Patient Education  2020 Elsevier Inc.  What types of side effects do monoclonal antibody drugs cause?  Common side effects  In general, the more common side effects caused by monoclonal antibody drugs include: . Allergic reactions, such as hives or itching . Flu-like signs and  symptoms, including chills, fatigue, fever, and muscle aches and pains . Nausea, vomiting . Diarrhea . Skin rashes . Low blood pressure   The CDC is recommending patients who receive monoclonal antibody treatments wait at least 90 days before being vaccinated.  Currently, there are no data on the safety and efficacy of mRNA COVID-19 vaccines in persons who received monoclonal antibodies or convalescent plasma as part of COVID-19 treatment. Based on the estimated half-life of such therapies as well as evidence suggesting that reinfection is uncommon in the 90 days after initial infection, vaccination should be deferred for at least 90 days, as a precautionary measure until additional information becomes available, to avoid interference of the antibody treatment with vaccine-induced immune responses.  What types of side effects do monoclonal antibody drugs cause?  Common side effects  In general, the more common side effects caused by monoclonal antibody drugs include: . Allergic reactions, such as hives or itching . Flu-like signs and symptoms, including chills, fatigue, fever, and muscle aches and pains . Nausea, vomiting . Diarrhea . Skin rashes . Low blood pressure   The CDC is recommending patients who receive monoclonal antibody treatments wait at least 90 days before being vaccinated.  Currently, there are no data on the safety and efficacy of mRNA COVID-19 vaccines in persons who received monoclonal antibodies or convalescent plasma as part of COVID-19 treatment. Based on the estimated half-life of such therapies as well as evidence suggesting that reinfection is uncommon in the 90 days after initial infection, vaccination should be deferred for at least 90 days, as a precautionary measure until   additional information becomes available, to avoid interference of the antibody treatment with vaccine-induced immune responses. 

## 2019-02-07 DIAGNOSIS — U071 COVID-19: Secondary | ICD-10-CM | POA: Diagnosis not present

## 2019-02-07 DIAGNOSIS — E7800 Pure hypercholesterolemia, unspecified: Secondary | ICD-10-CM | POA: Diagnosis not present

## 2019-02-07 DIAGNOSIS — E119 Type 2 diabetes mellitus without complications: Secondary | ICD-10-CM | POA: Diagnosis not present

## 2019-02-19 DIAGNOSIS — R05 Cough: Secondary | ICD-10-CM | POA: Diagnosis not present

## 2019-02-20 ENCOUNTER — Other Ambulatory Visit: Payer: Self-pay

## 2019-02-20 ENCOUNTER — Ambulatory Visit
Admission: RE | Admit: 2019-02-20 | Discharge: 2019-02-20 | Disposition: A | Discharge status: Home / Self Care | Payer: Medicare Other | Source: Ambulatory Visit | Attending: Family Medicine | Admitting: Family Medicine

## 2019-02-20 ENCOUNTER — Other Ambulatory Visit: Payer: Self-pay | Admitting: Family Medicine

## 2019-02-20 DIAGNOSIS — R05 Cough: Secondary | ICD-10-CM | POA: Diagnosis not present

## 2019-02-20 DIAGNOSIS — R059 Cough, unspecified: Secondary | ICD-10-CM

## 2019-03-08 DIAGNOSIS — Z8546 Personal history of malignant neoplasm of prostate: Secondary | ICD-10-CM | POA: Diagnosis not present

## 2019-03-08 DIAGNOSIS — E119 Type 2 diabetes mellitus without complications: Secondary | ICD-10-CM | POA: Diagnosis not present

## 2019-03-08 DIAGNOSIS — E7800 Pure hypercholesterolemia, unspecified: Secondary | ICD-10-CM | POA: Diagnosis not present

## 2019-03-08 DIAGNOSIS — C61 Malignant neoplasm of prostate: Secondary | ICD-10-CM | POA: Diagnosis not present

## 2019-03-08 DIAGNOSIS — N400 Enlarged prostate without lower urinary tract symptoms: Secondary | ICD-10-CM | POA: Diagnosis not present

## 2019-04-11 DIAGNOSIS — R69 Illness, unspecified: Secondary | ICD-10-CM | POA: Diagnosis not present

## 2019-08-15 DIAGNOSIS — G5603 Carpal tunnel syndrome, bilateral upper limbs: Secondary | ICD-10-CM | POA: Diagnosis not present

## 2019-08-15 DIAGNOSIS — Z1159 Encounter for screening for other viral diseases: Secondary | ICD-10-CM | POA: Diagnosis not present

## 2019-08-15 DIAGNOSIS — E1169 Type 2 diabetes mellitus with other specified complication: Secondary | ICD-10-CM | POA: Diagnosis not present

## 2019-08-15 DIAGNOSIS — E7800 Pure hypercholesterolemia, unspecified: Secondary | ICD-10-CM | POA: Diagnosis not present

## 2019-08-15 DIAGNOSIS — Z8546 Personal history of malignant neoplasm of prostate: Secondary | ICD-10-CM | POA: Diagnosis not present

## 2019-08-15 DIAGNOSIS — Z0000 Encounter for general adult medical examination without abnormal findings: Secondary | ICD-10-CM | POA: Diagnosis not present

## 2019-08-15 DIAGNOSIS — Z1389 Encounter for screening for other disorder: Secondary | ICD-10-CM | POA: Diagnosis not present

## 2019-08-15 DIAGNOSIS — M25571 Pain in right ankle and joints of right foot: Secondary | ICD-10-CM | POA: Diagnosis not present

## 2019-09-19 ENCOUNTER — Encounter: Payer: Self-pay | Admitting: Orthopaedic Surgery

## 2019-09-19 ENCOUNTER — Ambulatory Visit: Payer: Medicare HMO | Admitting: Orthopaedic Surgery

## 2019-09-19 ENCOUNTER — Ambulatory Visit (INDEPENDENT_AMBULATORY_CARE_PROVIDER_SITE_OTHER): Payer: Medicare HMO

## 2019-09-19 ENCOUNTER — Telehealth: Payer: Self-pay | Admitting: Physician Assistant

## 2019-09-19 VITALS — Ht 69.0 in | Wt 197.0 lb

## 2019-09-19 DIAGNOSIS — M659 Synovitis and tenosynovitis, unspecified: Secondary | ICD-10-CM | POA: Diagnosis not present

## 2019-09-19 DIAGNOSIS — M76821 Posterior tibial tendinitis, right leg: Secondary | ICD-10-CM

## 2019-09-19 MED ORDER — DICLOFENAC SODIUM 75 MG PO TBEC: 75.0000 mg | DELAYED_RELEASE_TABLET | Freq: Two times a day (BID) | ORAL | 1 refills | Status: DC | PRN

## 2019-09-19 MED ORDER — DICLOFENAC SODIUM 1 % EX GEL: 2.0000 g | Freq: Four times a day (QID) | CUTANEOUS | 0 refills | Status: DC

## 2019-09-19 NOTE — Progress Notes (Signed)
Office Visit Note   Patient: Frank Wells           Date of Birth: 04/28/1950           MRN: [id number] Visit Date: 09/19/2019              Requested by: Maryland Pink, MD [address] [place],  [place] [address] PCP: Maryland Pink, MD   Assessment & Plan: Visit Diagnoses:  1. Posterior tibial tendinitis, right leg   2. FCR (flexor carpi radialis) tenosynovitis     Plan: Impression is FCR tendinitis right wrist and right ankle posterior tibial tendinitis.  For the wrist, we have provided him with a removable wrist splint and a prescription for oral and topical Voltaren.  Have also recommended relative rest for both his wrist and ankle.  For his ankle, we have provided him with a cam walker today that he will wear when he is up walking around.  He will also use Voltaren gel gel there as well.  We will follow up with [place] in 3 weeks for recheck.  Follow-Up Instructions: Return in about 3 weeks (around 10/10/2019).   Orders:  Orders Placed This Encounter  Procedures  . XR Ankle Complete Right  . XR Wrist 2 Views Right   Meds ordered this encounter  Medications  . diclofenac (VOLTAREN) 75 MG EC tablet    Sig: Take 1 tablet (75 mg total) by mouth 2 (two) times daily as needed.    Dispense:  60 tablet    Refill:  1  . diclofenac Sodium (VOLTAREN) 1 % GEL    Sig: Apply 2 g topically 4 (four) times daily.    Dispense:  150 g    Refill:  0      Procedures: No procedures performed   Clinical Data: No additional findings.   Subjective: Chief Complaint  Patient presents with  . Right Ankle - Pain  . Right Wrist - Pain    HPI patient is a very pleasant right-hand-dominant 69 year old gentleman who presents our clinic today with right wrist and right ankle pain.  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  He has noticed pain to the right volar wrist for the past 4 to 6 weeks without a specific injury.  He describes this as a  constant pain with associated weakness.  His pain is worse when he is working with metal stent flowers as well as with any gripping or lifting.  He has been using ice and heat with mild relief of symptoms.  He has tried a brace at times which does seem to help.  Tylenol provides minimal relief.  No numbness, tingling or burning.  In regards to the right ankle, he has had pain to the medial aspect for the past 4 to 6 weeks.  This began after changing shoes.  The pain he has is to the posterior tibial tendon and most recently to the lateral ankle at times.  He has slight weakness.  He has been using an insert with mild relief of symptoms.  Review of Systems as detailed in HPI.  All others reviewed and are negative.   Objective: Vital Signs: Ht 5\' 9"  (1.753 m)   Wt 197 lb (89.4 kg)   BMI 29.09 kg/m   Physical Exam well-developed well-nourished gentleman in no acute distress.  Alert oriented x3.  Ortho Exam examination of the right wrist reveals moderate tenderness over the  FCR tendon.  No pain throughout any other part of the wrist.  No swelling.  Full range of motion.  Fingers are warm and well-perfused.  Right ankle exam shows moderate tenderness along the posterior tibial tendon.  Minimal tenderness to the peroneal tendon.  Full range of motion without pain.  He is neurovascular intact distally.  Specialty Comments:  No specialty comments available.  Imaging: XR Ankle Complete Right  Result Date: 09/19/2019 There appears to be an old avulsion fracture to the medial malleolus.  Otherwise, no acute findings  XR Wrist 2 Views Right  Result Date: 09/19/2019 No acute or structural abnormalities    PMFS History: Patient Active Problem List   Diagnosis Date Noted  . HYPERLIPIDEMIA 06/23/2008  . ALLERGIC RHINITIS 06/23/2008  . DYSPNEA 06/23/2008   Past Medical History:  Diagnosis Date  . Cancer [facility])    Prostate  . Chronic cough   . Diabetes mellitus without complication (HCC)     diet controlled  . Hyperlipidemia   . Inguinal hernia   . Umbilical hernia     Family History  Problem Relation Age of Onset  . CAD Father        CABG with redo  . Heart attack Paternal Grandfather     Past Surgical History:  Procedure Laterality Date  . APPENDECTOMY    . COLONOSCOPY WITH PROPOFOL N/A 11/10/2017   Procedure: COLONOSCOPY WITH PROPOFOL;  Surgeon: Manya Silvas, MD;  Location: [facility] ENDOSCOPY;  Service: Endoscopy;  Laterality: N/A;  . INGUINAL HERNIA REPAIR Bilateral   . TONSILLECTOMY    . umbilicial hernia repair     Social History   Occupational History  . Occupation: Conservation officer, nature at [employer]: [place] Use  . Smoking status: Never Smoker  . Smokeless tobacco: Never Used  Substance and Sexual Activity  . Alcohol use: No  . Drug use: No  . Sexual activity: Not on file

## 2019-09-19 NOTE — Telephone Encounter (Signed)
Patient aware to get the gel OTC

## 2019-09-19 NOTE — Telephone Encounter (Signed)
Patient called requesting a call to the pharmacy. Patient states pharmacy has only one prescription of diclofenac tablets and patient also was to receive diclofenac gel. Please call pharmacy for filling prescription also for gel. Patient phone number is [phone].

## 2019-10-03 DIAGNOSIS — N400 Enlarged prostate without lower urinary tract symptoms: Secondary | ICD-10-CM | POA: Diagnosis not present

## 2019-10-03 DIAGNOSIS — C61 Malignant neoplasm of prostate: Secondary | ICD-10-CM | POA: Diagnosis not present

## 2019-10-03 DIAGNOSIS — E1169 Type 2 diabetes mellitus with other specified complication: Secondary | ICD-10-CM | POA: Diagnosis not present

## 2019-10-03 DIAGNOSIS — Z8546 Personal history of malignant neoplasm of prostate: Secondary | ICD-10-CM | POA: Diagnosis not present

## 2019-10-03 DIAGNOSIS — E119 Type 2 diabetes mellitus without complications: Secondary | ICD-10-CM | POA: Diagnosis not present

## 2019-10-03 DIAGNOSIS — E7800 Pure hypercholesterolemia, unspecified: Secondary | ICD-10-CM | POA: Diagnosis not present

## 2019-10-09 DIAGNOSIS — L219 Seborrheic dermatitis, unspecified: Secondary | ICD-10-CM | POA: Diagnosis not present

## 2019-10-09 DIAGNOSIS — D492 Neoplasm of unspecified behavior of bone, soft tissue, and skin: Secondary | ICD-10-CM | POA: Diagnosis not present

## 2019-10-09 DIAGNOSIS — L918 Other hypertrophic disorders of the skin: Secondary | ICD-10-CM | POA: Diagnosis not present

## 2019-10-09 DIAGNOSIS — L570 Actinic keratosis: Secondary | ICD-10-CM | POA: Diagnosis not present

## 2019-10-15 ENCOUNTER — Ambulatory Visit: Payer: Medicare HMO | Admitting: Orthopaedic Surgery

## 2019-10-15 ENCOUNTER — Encounter: Payer: Self-pay | Admitting: Orthopaedic Surgery

## 2019-10-15 DIAGNOSIS — M659 Synovitis and tenosynovitis, unspecified: Secondary | ICD-10-CM

## 2019-10-15 DIAGNOSIS — M76821 Posterior tibial tendinitis, right leg: Secondary | ICD-10-CM | POA: Diagnosis not present

## 2019-10-15 MED ORDER — PREDNISONE 10 MG (21) PO TBPK: ORAL_TABLET | ORAL | 0 refills | Status: DC

## 2019-10-15 MED ORDER — MELOXICAM 7.5 MG PO TABS: 7.5000 mg | ORAL_TABLET | Freq: Two times a day (BID) | ORAL | 2 refills | Status: DC | PRN

## 2019-10-15 NOTE — Progress Notes (Signed)
Office Visit Note   Patient: Frank Wells           Date of Birth: [birth year]           MRN: [id number] Visit Date: 10/15/2019              Requested by: Maryland Pink, MD [address] [place],  [place] [address] PCP: Maryland Pink, MD   Assessment & Plan: Visit Diagnoses:  1. Posterior tibial tendinitis, right leg   2. FCR (flexor carpi radialis) tenosynovitis     Plan: Impression is slightly improved right FCR tendinosis and right posterior tibial tendon dysfunction.  I recommend continue to use the brace in the cam boot as needed.  He did agree to try a prednisone Dosepak and we will switch him over to meloxicam to take afterwards.  I do think a course of outpatient PT with iontophoresis would be beneficial.  Patient instructed to follow-up in about a month if he does not notice any improvement.  Follow-Up Instructions: Return if symptoms worsen or fail to improve.   Orders:  No orders of the defined types were placed in this encounter.  Meds ordered this encounter  Medications  . predniSONE (STERAPRED UNI-PAK 21 TAB) 10 MG (21) TBPK tablet    Sig: Take as directed    Dispense:  21 tablet    Refill:  0  . meloxicam (MOBIC) 7.5 MG tablet    Sig: Take 1 tablet (7.5 mg total) by mouth 2 (two) times daily as needed for pain.    Dispense:  30 tablet    Refill:  2      Procedures: No procedures performed   Clinical Data: No additional findings.   Subjective: Chief Complaint  Patient presents with  . Right Wrist - Pain  . Right Ankle - Pain    Frank Wells returns today for follow-up of posterior tibial tendon dysfunction and right FCR tenosynovitis.  Overall he is about 30 to 50% better.  He is currently not taking anything for the pain.  He has tried limiting what he does at work.   Review of Systems  Constitutional: Negative.   All other systems reviewed and are negative.    Objective: Vital Signs: There were no vitals taken for this  visit.  Physical Exam Vitals and nursing note reviewed.  Constitutional:      Appearance: He is well-developed.  Pulmonary:     Effort: Pulmonary effort is normal.  Abdominal:     Palpations: Abdomen is soft.  Skin:    General: Skin is warm.  Neurological:     Mental Status: He is alert and oriented to person, place, and time.  Psychiatric:        Behavior: Behavior normal.        Thought Content: Thought content normal.        Judgment: Judgment normal.     Ortho Exam Right wrist exam shows tenderness along the FCR tendon approximately 8 to 10 cm proximal to the wrist joint.  No significant pain with resisted wrist flexion.  No swelling or soft tissue crepitus.  Right ankle shows tenderness along the posterior tibial tendon just posterior to the medial malleolus.  He has good strength without significant pain.  Specialty Comments:  No specialty comments available.  Imaging: No results found.   PMFS History: Patient Active Problem List   Diagnosis Date Noted  . HYPERLIPIDEMIA 06/23/2008  . ALLERGIC RHINITIS 06/23/2008  . DYSPNEA 06/23/2008  Past Medical History:  Diagnosis Date  . Cancer [facility])    Prostate  . Chronic cough   . Diabetes mellitus without complication (HCC)    diet controlled  . Hyperlipidemia   . Inguinal hernia   . Umbilical hernia     Family History  Problem Relation Age of Onset  . CAD Father        CABG with redo  . Heart attack Paternal Grandfather     Past Surgical History:  Procedure Laterality Date  . APPENDECTOMY    . COLONOSCOPY WITH PROPOFOL N/A 11/10/2017   Procedure: COLONOSCOPY WITH PROPOFOL;  Surgeon: Manya Silvas, MD;  Location: [facility] ENDOSCOPY;  Service: Endoscopy;  Laterality: N/A;  . INGUINAL HERNIA REPAIR Bilateral   . TONSILLECTOMY    . umbilicial hernia repair     Social History   Occupational History  . 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 Smoking status: Never Smoker  .  Smokeless tobacco: Never Used  Substance and Sexual Activity  . Alcohol use: No  . Drug use: No  . Sexual activity: Not on file

## 2019-10-18 DIAGNOSIS — Z23 Encounter for immunization: Secondary | ICD-10-CM | POA: Diagnosis not present

## 2019-10-29 DIAGNOSIS — M25571 Pain in right ankle and joints of right foot: Secondary | ICD-10-CM | POA: Diagnosis not present

## 2019-10-29 DIAGNOSIS — M25531 Pain in right wrist: Secondary | ICD-10-CM | POA: Diagnosis not present

## 2019-10-29 DIAGNOSIS — R69 Illness, unspecified: Secondary | ICD-10-CM | POA: Diagnosis not present

## 2019-10-31 DIAGNOSIS — M25531 Pain in right wrist: Secondary | ICD-10-CM | POA: Diagnosis not present

## 2019-10-31 DIAGNOSIS — M25571 Pain in right ankle and joints of right foot: Secondary | ICD-10-CM | POA: Diagnosis not present

## 2019-11-05 DIAGNOSIS — M25531 Pain in right wrist: Secondary | ICD-10-CM | POA: Diagnosis not present

## 2019-11-05 DIAGNOSIS — M25571 Pain in right ankle and joints of right foot: Secondary | ICD-10-CM | POA: Diagnosis not present

## 2019-11-07 DIAGNOSIS — M25531 Pain in right wrist: Secondary | ICD-10-CM | POA: Diagnosis not present

## 2019-11-07 DIAGNOSIS — M25571 Pain in right ankle and joints of right foot: Secondary | ICD-10-CM | POA: Diagnosis not present

## 2019-11-12 DIAGNOSIS — M25571 Pain in right ankle and joints of right foot: Secondary | ICD-10-CM | POA: Diagnosis not present

## 2019-11-12 DIAGNOSIS — M25531 Pain in right wrist: Secondary | ICD-10-CM | POA: Diagnosis not present

## 2019-11-14 DIAGNOSIS — M25571 Pain in right ankle and joints of right foot: Secondary | ICD-10-CM | POA: Diagnosis not present

## 2019-11-14 DIAGNOSIS — M25531 Pain in right wrist: Secondary | ICD-10-CM | POA: Diagnosis not present

## 2019-11-19 DIAGNOSIS — M25571 Pain in right ankle and joints of right foot: Secondary | ICD-10-CM | POA: Diagnosis not present

## 2019-11-19 DIAGNOSIS — M25531 Pain in right wrist: Secondary | ICD-10-CM | POA: Diagnosis not present

## 2019-11-21 DIAGNOSIS — M25571 Pain in right ankle and joints of right foot: Secondary | ICD-10-CM | POA: Diagnosis not present

## 2019-11-21 DIAGNOSIS — M25531 Pain in right wrist: Secondary | ICD-10-CM | POA: Diagnosis not present

## 2019-11-26 DIAGNOSIS — M25571 Pain in right ankle and joints of right foot: Secondary | ICD-10-CM | POA: Diagnosis not present

## 2019-11-26 DIAGNOSIS — M25531 Pain in right wrist: Secondary | ICD-10-CM | POA: Diagnosis not present

## 2020-01-06 ENCOUNTER — Ambulatory Visit: Payer: Medicare HMO | Attending: Internal Medicine

## 2020-01-06 DIAGNOSIS — Z23 Encounter for immunization: Secondary | ICD-10-CM

## 2020-01-06 NOTE — Progress Notes (Signed)
   Covid-19 Vaccination Clinic  Name:  Frank Wells    MRN: [id number] DOB: [birth year]  01/06/2020  Frank Wells was observed post Covid-19 immunization for 15 minutes without incident. He was provided with Vaccine Information Sheet and instruction to access the V-Safe system.   Frank Wells was instructed to call 911 with any severe reactions post vaccine: Marland Kitchen Difficulty breathing  . Swelling of face and throat  . A fast heartbeat  . A bad rash all over body  . Dizziness and weakness   Immunizations Administered    Name Date Dose VIS Date Route   Pfizer COVID-19 Vaccine 01/06/2020  3:31 PM 0.3 mL 10/23/2019 Intramuscular   Manufacturer: [employer], [employer]   Lot: [id number]   NDC: [address]-4

## 2020-01-07 DIAGNOSIS — R972 Elevated prostate specific antigen [PSA]: Secondary | ICD-10-CM | POA: Diagnosis not present

## 2020-01-08 DIAGNOSIS — R972 Elevated prostate specific antigen [PSA]: Secondary | ICD-10-CM | POA: Diagnosis not present

## 2020-01-08 DIAGNOSIS — Z6828 Body mass index (BMI) 28.0-28.9, adult: Secondary | ICD-10-CM | POA: Diagnosis not present

## 2020-01-08 DIAGNOSIS — Z125 Encounter for screening for malignant neoplasm of prostate: Secondary | ICD-10-CM | POA: Diagnosis not present

## 2020-02-24 DIAGNOSIS — M2142 Flat foot [pes planus] (acquired), left foot: Secondary | ICD-10-CM | POA: Diagnosis not present

## 2020-02-24 DIAGNOSIS — M21621 Bunionette of right foot: Secondary | ICD-10-CM | POA: Diagnosis not present

## 2020-02-24 DIAGNOSIS — M2141 Flat foot [pes planus] (acquired), right foot: Secondary | ICD-10-CM | POA: Diagnosis not present

## 2020-02-24 DIAGNOSIS — D2371 Other benign neoplasm of skin of right lower limb, including hip: Secondary | ICD-10-CM | POA: Diagnosis not present

## 2020-02-24 DIAGNOSIS — D2372 Other benign neoplasm of skin of left lower limb, including hip: Secondary | ICD-10-CM | POA: Diagnosis not present

## 2020-02-24 DIAGNOSIS — Q828 Other specified congenital malformations of skin: Secondary | ICD-10-CM | POA: Diagnosis not present

## 2020-02-24 DIAGNOSIS — E1142 Type 2 diabetes mellitus with diabetic polyneuropathy: Secondary | ICD-10-CM | POA: Diagnosis not present

## 2020-02-24 DIAGNOSIS — M216X1 Other acquired deformities of right foot: Secondary | ICD-10-CM | POA: Diagnosis not present

## 2020-02-24 DIAGNOSIS — L851 Acquired keratosis [keratoderma] palmaris et plantaris: Secondary | ICD-10-CM | POA: Diagnosis not present

## 2020-02-24 DIAGNOSIS — L84 Corns and callosities: Secondary | ICD-10-CM | POA: Diagnosis not present

## 2020-02-25 DIAGNOSIS — L814 Other melanin hyperpigmentation: Secondary | ICD-10-CM | POA: Diagnosis not present

## 2020-02-25 DIAGNOSIS — D234 Other benign neoplasm of skin of scalp and neck: Secondary | ICD-10-CM | POA: Diagnosis not present

## 2020-02-25 DIAGNOSIS — D492 Neoplasm of unspecified behavior of bone, soft tissue, and skin: Secondary | ICD-10-CM | POA: Diagnosis not present

## 2020-02-25 DIAGNOSIS — L438 Other lichen planus: Secondary | ICD-10-CM | POA: Diagnosis not present

## 2020-02-25 DIAGNOSIS — L299 Pruritus, unspecified: Secondary | ICD-10-CM | POA: Diagnosis not present

## 2020-02-26 DIAGNOSIS — M3502 Sicca syndrome with lung involvement: Secondary | ICD-10-CM | POA: Diagnosis not present

## 2020-03-16 DIAGNOSIS — M216X1 Other acquired deformities of right foot: Secondary | ICD-10-CM | POA: Diagnosis not present

## 2020-03-16 DIAGNOSIS — M21621 Bunionette of right foot: Secondary | ICD-10-CM | POA: Diagnosis not present

## 2020-03-16 DIAGNOSIS — L851 Acquired keratosis [keratoderma] palmaris et plantaris: Secondary | ICD-10-CM | POA: Diagnosis not present

## 2020-03-16 DIAGNOSIS — D2371 Other benign neoplasm of skin of right lower limb, including hip: Secondary | ICD-10-CM | POA: Diagnosis not present

## 2020-03-16 DIAGNOSIS — L84 Corns and callosities: Secondary | ICD-10-CM | POA: Diagnosis not present

## 2020-03-16 DIAGNOSIS — M2141 Flat foot [pes planus] (acquired), right foot: Secondary | ICD-10-CM | POA: Diagnosis not present

## 2020-03-16 DIAGNOSIS — Q828 Other specified congenital malformations of skin: Secondary | ICD-10-CM | POA: Diagnosis not present

## 2020-03-16 DIAGNOSIS — M2142 Flat foot [pes planus] (acquired), left foot: Secondary | ICD-10-CM | POA: Diagnosis not present

## 2020-03-16 DIAGNOSIS — E1142 Type 2 diabetes mellitus with diabetic polyneuropathy: Secondary | ICD-10-CM | POA: Diagnosis not present

## 2020-03-16 DIAGNOSIS — D2372 Other benign neoplasm of skin of left lower limb, including hip: Secondary | ICD-10-CM | POA: Diagnosis not present

## 2020-03-24 DIAGNOSIS — Z8546 Personal history of malignant neoplasm of prostate: Secondary | ICD-10-CM | POA: Diagnosis not present

## 2020-03-24 DIAGNOSIS — E119 Type 2 diabetes mellitus without complications: Secondary | ICD-10-CM | POA: Diagnosis not present

## 2020-03-24 DIAGNOSIS — E7800 Pure hypercholesterolemia, unspecified: Secondary | ICD-10-CM | POA: Diagnosis not present

## 2020-04-06 DIAGNOSIS — M216X2 Other acquired deformities of left foot: Secondary | ICD-10-CM | POA: Diagnosis not present

## 2020-04-06 DIAGNOSIS — M2011 Hallux valgus (acquired), right foot: Secondary | ICD-10-CM | POA: Diagnosis not present

## 2020-04-06 DIAGNOSIS — M21622 Bunionette of left foot: Secondary | ICD-10-CM | POA: Diagnosis not present

## 2020-04-06 DIAGNOSIS — M216X1 Other acquired deformities of right foot: Secondary | ICD-10-CM | POA: Diagnosis not present

## 2020-04-06 DIAGNOSIS — M21621 Bunionette of right foot: Secondary | ICD-10-CM | POA: Diagnosis not present

## 2020-04-06 DIAGNOSIS — M2141 Flat foot [pes planus] (acquired), right foot: Secondary | ICD-10-CM | POA: Diagnosis not present

## 2020-04-06 DIAGNOSIS — M2142 Flat foot [pes planus] (acquired), left foot: Secondary | ICD-10-CM | POA: Diagnosis not present

## 2020-04-06 DIAGNOSIS — E1142 Type 2 diabetes mellitus with diabetic polyneuropathy: Secondary | ICD-10-CM | POA: Diagnosis not present

## 2020-04-06 DIAGNOSIS — L851 Acquired keratosis [keratoderma] palmaris et plantaris: Secondary | ICD-10-CM | POA: Diagnosis not present

## 2020-05-28 ENCOUNTER — Ambulatory Visit
Admission: RE | Admit: 2020-05-28 | Discharge: 2020-05-28 | Disposition: A | Discharge status: Home / Self Care | Payer: Medicare HMO | Source: Ambulatory Visit | Attending: Family Medicine | Admitting: Family Medicine

## 2020-05-28 ENCOUNTER — Other Ambulatory Visit: Payer: Self-pay | Admitting: Family Medicine

## 2020-05-28 DIAGNOSIS — R0781 Pleurodynia: Secondary | ICD-10-CM

## 2020-05-28 DIAGNOSIS — I700 Atherosclerosis of aorta: Secondary | ICD-10-CM | POA: Diagnosis not present

## 2020-05-28 DIAGNOSIS — R0789 Other chest pain: Secondary | ICD-10-CM | POA: Diagnosis not present

## 2020-06-08 DIAGNOSIS — E1169 Type 2 diabetes mellitus with other specified complication: Secondary | ICD-10-CM | POA: Diagnosis not present

## 2020-06-08 DIAGNOSIS — E119 Type 2 diabetes mellitus without complications: Secondary | ICD-10-CM | POA: Diagnosis not present

## 2020-06-08 DIAGNOSIS — N400 Enlarged prostate without lower urinary tract symptoms: Secondary | ICD-10-CM | POA: Diagnosis not present

## 2020-06-08 DIAGNOSIS — E7800 Pure hypercholesterolemia, unspecified: Secondary | ICD-10-CM | POA: Diagnosis not present

## 2020-06-30 DIAGNOSIS — M216X1 Other acquired deformities of right foot: Secondary | ICD-10-CM | POA: Diagnosis not present

## 2020-06-30 DIAGNOSIS — S90229A Contusion of unspecified lesser toe(s) with damage to nail, initial encounter: Secondary | ICD-10-CM | POA: Diagnosis not present

## 2020-06-30 DIAGNOSIS — M2022 Hallux rigidus, left foot: Secondary | ICD-10-CM | POA: Diagnosis not present

## 2020-06-30 DIAGNOSIS — M2011 Hallux valgus (acquired), right foot: Secondary | ICD-10-CM | POA: Diagnosis not present

## 2020-06-30 DIAGNOSIS — S92502A Displaced unspecified fracture of left lesser toe(s), initial encounter for closed fracture: Secondary | ICD-10-CM | POA: Diagnosis not present

## 2020-06-30 DIAGNOSIS — E1142 Type 2 diabetes mellitus with diabetic polyneuropathy: Secondary | ICD-10-CM | POA: Diagnosis not present

## 2020-06-30 DIAGNOSIS — M79672 Pain in left foot: Secondary | ICD-10-CM | POA: Diagnosis not present

## 2020-06-30 DIAGNOSIS — M21621 Bunionette of right foot: Secondary | ICD-10-CM | POA: Diagnosis not present

## 2020-06-30 DIAGNOSIS — M2141 Flat foot [pes planus] (acquired), right foot: Secondary | ICD-10-CM | POA: Diagnosis not present

## 2020-06-30 DIAGNOSIS — S99922A Unspecified injury of left foot, initial encounter: Secondary | ICD-10-CM | POA: Diagnosis not present

## 2020-07-13 ENCOUNTER — Other Ambulatory Visit: Payer: Self-pay

## 2020-07-13 ENCOUNTER — Other Ambulatory Visit: Payer: Self-pay | Admitting: Family Medicine

## 2020-07-13 ENCOUNTER — Ambulatory Visit
Admission: RE | Admit: 2020-07-13 | Discharge: 2020-07-13 | Disposition: A | Discharge status: Home / Self Care | Payer: Medicare HMO | Source: Ambulatory Visit | Attending: Family Medicine | Admitting: Family Medicine

## 2020-07-13 DIAGNOSIS — R0781 Pleurodynia: Secondary | ICD-10-CM | POA: Diagnosis not present

## 2020-07-13 DIAGNOSIS — S0990XA Unspecified injury of head, initial encounter: Secondary | ICD-10-CM | POA: Diagnosis not present

## 2020-07-13 DIAGNOSIS — R52 Pain, unspecified: Secondary | ICD-10-CM

## 2020-07-14 DIAGNOSIS — H2513 Age-related nuclear cataract, bilateral: Secondary | ICD-10-CM | POA: Diagnosis not present

## 2020-07-17 DIAGNOSIS — Z0100 Encounter for examination of eyes and vision without abnormal findings: Secondary | ICD-10-CM | POA: Diagnosis not present

## 2020-07-29 DIAGNOSIS — M216X1 Other acquired deformities of right foot: Secondary | ICD-10-CM | POA: Diagnosis not present

## 2020-07-29 DIAGNOSIS — M2141 Flat foot [pes planus] (acquired), right foot: Secondary | ICD-10-CM | POA: Diagnosis not present

## 2020-07-29 DIAGNOSIS — S99922D Unspecified injury of left foot, subsequent encounter: Secondary | ICD-10-CM | POA: Diagnosis not present

## 2020-07-29 DIAGNOSIS — M2022 Hallux rigidus, left foot: Secondary | ICD-10-CM | POA: Diagnosis not present

## 2020-07-29 DIAGNOSIS — M216X2 Other acquired deformities of left foot: Secondary | ICD-10-CM | POA: Diagnosis not present

## 2020-07-29 DIAGNOSIS — S92502D Displaced unspecified fracture of left lesser toe(s), subsequent encounter for fracture with routine healing: Secondary | ICD-10-CM | POA: Diagnosis not present

## 2020-07-29 DIAGNOSIS — M79672 Pain in left foot: Secondary | ICD-10-CM | POA: Diagnosis not present

## 2020-07-29 DIAGNOSIS — M2142 Flat foot [pes planus] (acquired), left foot: Secondary | ICD-10-CM | POA: Diagnosis not present

## 2020-07-29 DIAGNOSIS — E1169 Type 2 diabetes mellitus with other specified complication: Secondary | ICD-10-CM | POA: Diagnosis not present

## 2020-07-29 DIAGNOSIS — M21621 Bunionette of right foot: Secondary | ICD-10-CM | POA: Diagnosis not present

## 2020-07-29 DIAGNOSIS — Q828 Other specified congenital malformations of skin: Secondary | ICD-10-CM | POA: Diagnosis not present

## 2020-07-29 DIAGNOSIS — E1142 Type 2 diabetes mellitus with diabetic polyneuropathy: Secondary | ICD-10-CM | POA: Diagnosis not present

## 2020-07-29 DIAGNOSIS — M2011 Hallux valgus (acquired), right foot: Secondary | ICD-10-CM | POA: Diagnosis not present

## 2020-08-17 ENCOUNTER — Ambulatory Visit: Payer: Medicare HMO | Admitting: Cardiology

## 2020-08-17 ENCOUNTER — Encounter: Payer: Self-pay | Admitting: Cardiology

## 2020-08-17 VITALS — BP 120/74 | HR 64 | Ht 70.0 in | Wt 197.0 lb

## 2020-08-17 DIAGNOSIS — E119 Type 2 diabetes mellitus without complications: Secondary | ICD-10-CM

## 2020-08-17 DIAGNOSIS — Z8249 Family history of ischemic heart disease and other diseases of the circulatory system: Secondary | ICD-10-CM

## 2020-08-17 DIAGNOSIS — I10 Essential (primary) hypertension: Secondary | ICD-10-CM | POA: Diagnosis not present

## 2020-08-17 NOTE — Addendum Note (Signed)
Addended by: Antonieta Iba on: 08/17/2020 08:42 AM   Modules accepted: Orders

## 2020-08-17 NOTE — Progress Notes (Signed)
Cardiology CONSULT Note    Date:  08/17/2020   ID:  Frank Wells, DOB Aug 15, 1950, MRN [id number]  PCP:  Frank Margarita, MD  Cardiologist:  Fransico Him, MD   Chief Complaint  Patient presents with   New Patient (Initial Visit)    Fm hx of CAD and hx of HLD and DM     History of Present Illness:  Frank Wells is a 70 y.o. male who is being seen today for the evaluation of family history of premature CAD at the request of Frank Pink, MD.  This is a 70yo male with a hx of prostate CA, DM diet controlled and HLD.  He has a family hx of premature CAD with is father have CABG and paternal grandfather with MI.  He is now referred for evaluation due to fm hx of CAD and CRfs including DM, HLD.  He has never smoked.  He had a rib injury back in May and a Cxray showed mild aortic atherosclerosis. He denies any chest pain or pressure, SOB, DOE, PND, orthopnea, LE edema, dizziness, palpitations or syncope. He is compliant with his meds and is tolerating meds with no SE.     Past Medical History:  Diagnosis Date   Cancer [facility])    Prostate   Chronic cough    Diabetes mellitus without complication ([place])    diet controlled   Hyperlipidemia    Inguinal hernia    Umbilical hernia     Past Surgical History:  Procedure Laterality Date   APPENDECTOMY     COLONOSCOPY WITH PROPOFOL N/A 11/10/2017   Procedure: COLONOSCOPY WITH PROPOFOL;  Surgeon: Frank Silvas, MD;  Location: [facility] ENDOSCOPY;  Service: Endoscopy;  Laterality: N/A;   INGUINAL HERNIA REPAIR Bilateral    TONSILLECTOMY     umbilicial hernia repair      Current Medications: Current Meds  Medication Sig   albuterol (PROVENTIL HFA;VENTOLIN HFA) 108 (90 Base) MCG/ACT inhaler Inhale 2 puffs into the lungs every 6 (six) hours as needed for wheezing or shortness of breath.   rosuvastatin (CRESTOR) 10 MG tablet Take 10 mg by mouth daily.    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   ROS:   Please see the history of present illness.    ROS All other systems reviewed and are negative.  No flowsheet data found.   PHYSICAL EXAM:   VS:  BP 120/74   Pulse 64   Ht 5'  10" (1.778 m)   Wt 197 lb (89.4 kg)   SpO2 97%   BMI 28.27 kg/m    GEN: Well nourished, well developed, in no acute distress  HEENT: normal  Neck: no JVD, carotid bruits, or masses Cardiac: RRR; no murmurs, rubs, or gallops,no edema.  Intact distal pulses bilaterally.  Respiratory:  clear to auscultation bilaterally, normal work of breathing GI: soft, nontender, nondistended, + BS MS: no deformity or atrophy  Skin: warm and dry, no rash Neuro:  Alert and Oriented x 3, Strength and sensation are intact Psych: euthymic mood, full affect  Wt Readings from Last 3 Encounters:  08/17/20 197 lb (89.4 kg)  09/19/19 197 lb (89.4 kg)  11/10/17 197 lb (89.4 kg)      Studies/Labs Reviewed:   EKG:  EKG is ordered today.  The ekg ordered today demonstrates NSR with no ST changes  Recent Labs: No results found for requested labs within last 8760 hours.   Lipid Panel No results found for: CHOL,  TRIG, HDL, CHOLHDL, VLDL, LDLCALC, LDLDIRECT Additional studies/ records that were reviewed today include:  OV notes from PCP    ASSESSMENT:    1. Family history of premature CAD   2. Primary hypertension   3. DM type 2, goal HbA1c < 7% (HCC)      PLAN:  In order of problems listed above:  Family hx of premature CAD -he is asymptomatic from a cardiac standpoint -his father and paternal GF had CAD -his CRFs include HLD and DM2 -EKG is nonischemic -I will get a coronary Ca score -If coronary Ca score > 600 then will recommend Stress myoview -Shared Decision Making/Informed Consent The risks [chest pain, shortness of breath, cardiac arrhythmias, dizziness, blood pressure fluctuations, myocardial infarction, stroke/transient ischemic attack, nausea, vomiting, allergic reaction, radiation exposure, metallic taste sensation and life-threatening complications (estimated to be 1 in 10,000)], benefits (risk stratification, diagnosing coronary artery disease, treatment guidance) and alternatives of a nuclear stress test were discussed in detail with Mr. Shia and he agrees to proceed.  2.  HLD -followed by PCP -LDL was 64 in Sept 2021 on review of labs from PCP -continue Crestor '10mg'$  daily  3.  Type 2 DM -followed by PCP -HbA1C was 5.9% in March 2022 -diet controlled  Time Spent: 25 minutes total time of encounter, including 15 minutes spent in face-to-face patient care on the date of this encounter. This time includes coordination of care and counseling regarding above mentioned problem list. Remainder of non-face-to-face time involved reviewing chart documents/testing relevant to the patient encounter and documentation in the medical record. I have independently reviewed documentation from referring provider  Medication Adjustments/Labs and Tests Ordered: Current medicines are reviewed at length with the patient today.  Concerns regarding medicines are outlined above.  Medication  changes, Labs and Tests ordered today are listed in the Patient Instructions below.  There are no Patient Instructions on file for this visit.   Signed, Fransico Him, MD  08/17/2020 8:22 AM    [facility] [address], [place], [place]  [address] Phone: [phone]; Fax: [phone]

## 2020-08-17 NOTE — Patient Instructions (Signed)
Medication Instructions:  Your physician recommends that you continue on your current medications as directed. Please refer to the Current Medication list given to you today.  *If you need a refill on your cardiac medications before your next appointment, please call your pharmacy*   Testing/Procedures: Your provider has recommended that you have a calcium score CT scan.    Follow-Up: At [facility], you and your health needs are our priority.  As part of our continuing mission to provide you with exceptional heart care, we have created designated Provider Care Teams.  These Care Teams include your primary Cardiologist (physician) and Advanced Practice Providers (APPs -  Physician Assistants and Nurse Practitioners) who all work together to provide you with the care you need, when you need it.  Follow up with Dr. Radford Pax as needed based on results of testing.

## 2020-09-01 ENCOUNTER — Other Ambulatory Visit: Payer: Self-pay

## 2020-09-01 ENCOUNTER — Encounter: Payer: Self-pay | Admitting: Cardiology

## 2020-09-01 ENCOUNTER — Ambulatory Visit (INDEPENDENT_AMBULATORY_CARE_PROVIDER_SITE_OTHER)
Admission: RE | Admit: 2020-09-01 | Discharge: 2020-09-01 | Disposition: A | Discharge status: Home / Self Care | Payer: Self-pay | Source: Ambulatory Visit | Attending: Cardiology | Admitting: Cardiology

## 2020-09-01 DIAGNOSIS — R931 Abnormal findings on diagnostic imaging of heart and coronary circulation: Secondary | ICD-10-CM | POA: Insufficient documentation

## 2020-09-01 DIAGNOSIS — Z8249 Family history of ischemic heart disease and other diseases of the circulatory system: Secondary | ICD-10-CM

## 2020-09-03 ENCOUNTER — Telehealth: Payer: Self-pay

## 2020-09-03 DIAGNOSIS — Z8249 Family history of ischemic heart disease and other diseases of the circulatory system: Secondary | ICD-10-CM

## 2020-09-03 NOTE — Telephone Encounter (Signed)
-----   Message from Frank Margarita, MD sent at 09/01/2020  2:17 PM EDT ----- Coronary Ca score elevated at 70.8 which placed him in the 80m% for age and sex matched controls. Please have him come in for FLP and ALT

## 2020-09-03 NOTE — Telephone Encounter (Signed)
The patient has been notified of the result and verbalized understanding.  All questions (if any) were answered. Antonieta Iba, RN 09/03/2020 1:33 PM  He will come in for lab work tomorrow 9/2.

## 2020-09-04 ENCOUNTER — Other Ambulatory Visit: Payer: Self-pay

## 2020-09-04 ENCOUNTER — Other Ambulatory Visit: Payer: Medicare HMO | Admitting: *Deleted

## 2020-09-04 DIAGNOSIS — Z8249 Family history of ischemic heart disease and other diseases of the circulatory system: Secondary | ICD-10-CM

## 2020-09-04 DIAGNOSIS — I10 Essential (primary) hypertension: Secondary | ICD-10-CM | POA: Diagnosis not present

## 2020-09-04 DIAGNOSIS — E119 Type 2 diabetes mellitus without complications: Secondary | ICD-10-CM | POA: Diagnosis not present

## 2020-09-04 DIAGNOSIS — E785 Hyperlipidemia, unspecified: Secondary | ICD-10-CM | POA: Diagnosis not present

## 2020-09-04 LAB — LIPID PANEL
Chol/HDL Ratio: 2.6 ratio (ref 0.0–5.0)
Cholesterol, Total: 135 mg/dL (ref 100–199)
HDL: 52 mg/dL (ref >39)
LDL Chol Calc (NIH): 70 mg/dL (ref 0–99)
Triglycerides: 63 mg/dL (ref 0–149)
VLDL Cholesterol Cal: 13 mg/dL (ref 5–40)

## 2020-09-04 LAB — ALT: ALT: 25 IU/L (ref 0–44)

## 2020-09-15 DIAGNOSIS — I700 Atherosclerosis of aorta: Secondary | ICD-10-CM | POA: Diagnosis not present

## 2020-09-15 DIAGNOSIS — Z1389 Encounter for screening for other disorder: Secondary | ICD-10-CM | POA: Diagnosis not present

## 2020-09-15 DIAGNOSIS — E7800 Pure hypercholesterolemia, unspecified: Secondary | ICD-10-CM | POA: Diagnosis not present

## 2020-09-15 DIAGNOSIS — Z23 Encounter for immunization: Secondary | ICD-10-CM | POA: Diagnosis not present

## 2020-09-15 DIAGNOSIS — Z8546 Personal history of malignant neoplasm of prostate: Secondary | ICD-10-CM | POA: Diagnosis not present

## 2020-09-15 DIAGNOSIS — Z1331 Encounter for screening for depression: Secondary | ICD-10-CM | POA: Diagnosis not present

## 2020-09-15 DIAGNOSIS — M25571 Pain in right ankle and joints of right foot: Secondary | ICD-10-CM | POA: Diagnosis not present

## 2020-09-15 DIAGNOSIS — M25512 Pain in left shoulder: Secondary | ICD-10-CM | POA: Diagnosis not present

## 2020-09-15 DIAGNOSIS — E119 Type 2 diabetes mellitus without complications: Secondary | ICD-10-CM | POA: Diagnosis not present

## 2020-09-15 DIAGNOSIS — Z0000 Encounter for general adult medical examination without abnormal findings: Secondary | ICD-10-CM | POA: Diagnosis not present

## 2020-10-12 DIAGNOSIS — M2011 Hallux valgus (acquired), right foot: Secondary | ICD-10-CM | POA: Diagnosis not present

## 2020-10-12 DIAGNOSIS — M2141 Flat foot [pes planus] (acquired), right foot: Secondary | ICD-10-CM | POA: Diagnosis not present

## 2020-10-12 DIAGNOSIS — M2022 Hallux rigidus, left foot: Secondary | ICD-10-CM | POA: Diagnosis not present

## 2020-10-12 DIAGNOSIS — M216X1 Other acquired deformities of right foot: Secondary | ICD-10-CM | POA: Diagnosis not present

## 2020-10-12 DIAGNOSIS — S99922D Unspecified injury of left foot, subsequent encounter: Secondary | ICD-10-CM | POA: Diagnosis not present

## 2020-10-12 DIAGNOSIS — M79672 Pain in left foot: Secondary | ICD-10-CM | POA: Diagnosis not present

## 2020-10-12 DIAGNOSIS — M21621 Bunionette of right foot: Secondary | ICD-10-CM | POA: Diagnosis not present

## 2020-10-12 DIAGNOSIS — S92502D Displaced unspecified fracture of left lesser toe(s), subsequent encounter for fracture with routine healing: Secondary | ICD-10-CM | POA: Diagnosis not present

## 2020-10-12 DIAGNOSIS — M2142 Flat foot [pes planus] (acquired), left foot: Secondary | ICD-10-CM | POA: Diagnosis not present

## 2020-10-12 DIAGNOSIS — E1142 Type 2 diabetes mellitus with diabetic polyneuropathy: Secondary | ICD-10-CM | POA: Diagnosis not present

## 2020-10-27 DIAGNOSIS — D492 Neoplasm of unspecified behavior of bone, soft tissue, and skin: Secondary | ICD-10-CM | POA: Diagnosis not present

## 2020-10-27 DIAGNOSIS — Z85828 Personal history of other malignant neoplasm of skin: Secondary | ICD-10-CM | POA: Diagnosis not present

## 2020-10-27 DIAGNOSIS — L820 Inflamed seborrheic keratosis: Secondary | ICD-10-CM | POA: Diagnosis not present

## 2020-10-27 DIAGNOSIS — C44519 Basal cell carcinoma of skin of other part of trunk: Secondary | ICD-10-CM | POA: Diagnosis not present

## 2020-10-27 DIAGNOSIS — D229 Melanocytic nevi, unspecified: Secondary | ICD-10-CM | POA: Diagnosis not present

## 2020-10-27 DIAGNOSIS — L814 Other melanin hyperpigmentation: Secondary | ICD-10-CM | POA: Diagnosis not present

## 2020-10-27 DIAGNOSIS — R69 Illness, unspecified: Secondary | ICD-10-CM | POA: Diagnosis not present

## 2020-12-22 DIAGNOSIS — J45909 Unspecified asthma, uncomplicated: Secondary | ICD-10-CM | POA: Diagnosis not present

## 2020-12-22 DIAGNOSIS — R079 Chest pain, unspecified: Secondary | ICD-10-CM | POA: Diagnosis not present

## 2020-12-22 DIAGNOSIS — R051 Acute cough: Secondary | ICD-10-CM | POA: Diagnosis not present

## 2020-12-22 DIAGNOSIS — C44519 Basal cell carcinoma of skin of other part of trunk: Secondary | ICD-10-CM | POA: Diagnosis not present

## 2020-12-22 DIAGNOSIS — U071 COVID-19: Secondary | ICD-10-CM | POA: Diagnosis not present

## 2020-12-22 DIAGNOSIS — R0982 Postnasal drip: Secondary | ICD-10-CM | POA: Diagnosis not present

## 2020-12-23 ENCOUNTER — Emergency Department
Admission: EM | Admit: 2020-12-23 | Discharge: 2020-12-23 | Disposition: A | Discharge status: Home / Self Care | Payer: Medicare HMO | Attending: Emergency Medicine | Admitting: Emergency Medicine

## 2020-12-23 ENCOUNTER — Other Ambulatory Visit: Payer: Self-pay

## 2020-12-23 ENCOUNTER — Emergency Department: Payer: Medicare HMO

## 2020-12-23 DIAGNOSIS — U071 COVID-19: Secondary | ICD-10-CM | POA: Diagnosis not present

## 2020-12-23 DIAGNOSIS — R0602 Shortness of breath: Secondary | ICD-10-CM | POA: Diagnosis not present

## 2020-12-23 DIAGNOSIS — Z8546 Personal history of malignant neoplasm of prostate: Secondary | ICD-10-CM | POA: Insufficient documentation

## 2020-12-23 DIAGNOSIS — E119 Type 2 diabetes mellitus without complications: Secondary | ICD-10-CM | POA: Insufficient documentation

## 2020-12-23 DIAGNOSIS — R059 Cough, unspecified: Secondary | ICD-10-CM | POA: Diagnosis present

## 2020-12-23 LAB — CBC
HCT: 45.8 % (ref 39.0–52.0)
Hemoglobin: 15.4 g/dL (ref 13.0–17.0)
MCH: 30.6 pg (ref 26.0–34.0)
MCHC: 33.6 g/dL (ref 30.0–36.0)
MCV: 91.1 fL (ref 80.0–100.0)
Platelets: 197 10*3/uL (ref 150–400)
RBC: 5.03 MIL/uL (ref 4.22–5.81)
RDW: 12.6 % (ref 11.5–15.5)
WBC: 6.3 10*3/uL (ref 4.0–10.5)
nRBC: 0 % (ref 0.0–0.2)

## 2020-12-23 LAB — BASIC METABOLIC PANEL
Anion gap: 7 (ref 5–15)
BUN: 17 mg/dL (ref 8–23)
CO2: 24 mmol/L (ref 22–32)
Calcium: 9.1 mg/dL (ref 8.9–10.3)
Chloride: 105 mmol/L (ref 98–111)
Creatinine, Ser: 0.86 mg/dL (ref 0.61–1.24)
GFR, Estimated: >60 mL/min (ref >60)
Glucose, Bld: 105 mg/dL — ABNORMAL HIGH (ref 70–99)
Potassium: 4 mmol/L (ref 3.5–5.1)
Sodium: 136 mmol/L (ref 135–145)

## 2020-12-23 LAB — CBG MONITORING, ED: Glucose-Capillary: 107 mg/dL — ABNORMAL HIGH (ref 70–99)

## 2020-12-23 LAB — LACTIC ACID, PLASMA: Lactic Acid, Venous: 0.8 mmol/L (ref 0.5–1.9)

## 2020-12-23 MED ORDER — ACETAMINOPHEN 500 MG PO TABS
1000.0000 mg | ORAL_TABLET | Freq: Once | ORAL | Status: AC
  Administered 2020-12-23: 20:00:00 1000 mg via ORAL
  Filled 2020-12-23: qty 2

## 2020-12-23 MED ORDER — ALBUTEROL SULFATE (2.5 MG/3ML) 0.083% IN NEBU
2.5000 mg | INHALATION_SOLUTION | Freq: Once | RESPIRATORY_TRACT | Status: AC
  Administered 2020-12-23: 20:00:00 2.5 mg via RESPIRATORY_TRACT
  Filled 2020-12-23: qty 3

## 2020-12-23 MED ORDER — IBUPROFEN 400 MG PO TABS
400.0000 mg | ORAL_TABLET | Freq: Once | ORAL | Status: AC
  Administered 2020-12-23: 20:00:00 400 mg via ORAL
  Filled 2020-12-23: qty 1

## 2020-12-23 NOTE — ED Triage Notes (Signed)
Per pt wife, pt started with a scratchy throat 2 days ago and last night tested positive for covid, today is very lethargic, having to use painful stimuli to rouse the pt. States he ate breakfast but has not eaten lunch

## 2020-12-23 NOTE — ED Provider Notes (Signed)
[facility]  ____________________________________________   Event Date/Time   First MD Initiated Contact with Patient 12/23/20 1855     (approximate)  I have reviewed the triage vital signs and the nursing notes.   HISTORY  Chief Complaint Fatigue and Covid Positive    HPI Frank Wells is a 70 y.o. male with past medical history of diabetes, hyperlipidemia, pulmonary fibrosis who presents with body aches cough and shortness of breath.  Patient had a sore throat on Monday, 2 days ago.  Initially took home COVID test that was negative.  He then started to have body aches cough dyspnea and fatigue yesterday.  He went to urgent care and had a positive COVID test.  Today symptoms seem worse, more fatigue, not eating and dyspnea.  He did have chest pain yesterday which is largely resolved.  He was prescribed Paxil bid by his physician but he has not yet picked it up.  They were concerned because his oxygen saturations at home were 88% . Patient denies any GI symptoms.          Past Medical History:  Diagnosis Date   Agatston coronary artery calcium score less than 100    70.8 Ca score CT 08/2020   Cancer [facility])    Prostate   Chronic cough    Diabetes mellitus without complication (HCC)    diet controlled   Hyperlipidemia    Inguinal hernia    Umbilical hernia     Patient Active Problem List   Diagnosis Date Noted   Agatston coronary artery calcium score less than 100 09/01/2020   HLD (hyperlipidemia) 06/23/2008   ALLERGIC RHINITIS 06/23/2008   DYSPNEA 06/23/2008    Past Surgical History:  Procedure Laterality Date   APPENDECTOMY     COLONOSCOPY WITH PROPOFOL N/A 11/10/2017   Procedure: COLONOSCOPY WITH PROPOFOL;  Surgeon: Manya Silvas, MD;  Location: [facility] ENDOSCOPY;  Service: Endoscopy;  Laterality: N/A;   INGUINAL HERNIA REPAIR Bilateral    TONSILLECTOMY     umbilicial hernia repair      Prior to Admission medications   Medication Sig  Start Date End Date Taking? Authorizing Provider  albuterol (PROVENTIL HFA;VENTOLIN HFA) 108 (90 Base) MCG/ACT inhaler Inhale 2 puffs into the lungs every 6 (six) hours as needed for wheezing or shortness of breath.    [provider]  diclofenac (VOLTAREN) 75 MG EC tablet Take 1 tablet (75 mg total) by mouth 2 (two) times daily as needed. 09/19/19   Aundra Dubin, PA-C  diclofenac Sodium (VOLTAREN) 1 % GEL Apply 2 g topically 4 (four) times daily. 09/19/19   Aundra Dubin, PA-C  meloxicam (MOBIC) 7.5 MG tablet Take 1 tablet (7.5 mg total) by mouth 2 (two) times daily as needed for pain. 10/15/19   Leandrew Koyanagi, MD  predniSONE (STERAPRED UNI-PAK 21 TAB) 10 MG (21) TBPK tablet Take as directed 10/15/19   Leandrew Koyanagi, MD  rosuvastatin (CRESTOR) 10 MG tablet Take 10 mg by mouth daily.    [provider]    Allergies Codeine  Family History  Problem Relation Age of Onset   CAD Father        CABG with redo   Heart attack Paternal Grandfather     Social History Social History   Tobacco Use   Smoking status: Never   Smokeless tobacco: Never  Substance Use Topics   Alcohol use: No   Drug use: No    Review of Systems  Review of Systems  Constitutional:  Positive for appetite change. Negative for chills and fever.  Respiratory:  Positive for cough, chest tightness and shortness of breath.   Cardiovascular:  Negative for chest pain.  Gastrointestinal:  Negative for abdominal pain, diarrhea, nausea and vomiting.  Musculoskeletal:  Positive for myalgias.  Neurological:  Positive for headaches.  All other systems reviewed and are negative.  Physical Exam Updated Vital Signs BP 124/74 (BP Location: Left Arm)    Pulse 88    Temp 100 F (37.8 C) (Oral)    Resp 15    Ht 5\' 9"  (1.753 m)    Wt 90.7 kg    SpO2 95%    BMI 29.53 kg/m   Physical Exam Vitals and nursing note reviewed.  Constitutional:      General: He is not in acute distress.    Appearance: Normal  appearance.     Comments: Patient appears fatigued but is nontoxic  HENT:     Head: Normocephalic and atraumatic.  Eyes:     General: No scleral icterus.    Conjunctiva/sclera: Conjunctivae normal.  Cardiovascular:     Rate and Rhythm: Normal rate and regular rhythm.  Pulmonary:     Effort: Pulmonary effort is normal. No respiratory distress.     Breath sounds: Normal breath sounds. No wheezing.  Abdominal:     General: Abdomen is flat. There is no distension.     Palpations: Abdomen is soft.     Tenderness: There is no abdominal tenderness. There is no guarding.  Musculoskeletal:        General: No deformity or signs of injury.     Cervical back: Normal range of motion.  Skin:    Coloration: Skin is not jaundiced or pale.  Neurological:     General: No focal deficit present.     Mental Status: He is alert and oriented to person, place, and time. Mental status is at baseline.  Psychiatric:        Mood and Affect: Mood normal.        Behavior: Behavior normal.     LABS (all labs ordered are listed, but only abnormal results are displayed)  Labs Reviewed  BASIC METABOLIC PANEL - Abnormal; Notable for the following components:      Result Value   Glucose, Bld 105 (*)    All other components within normal limits  CBG MONITORING, ED - Abnormal; Notable for the following components:   Glucose-Capillary 107 (*)    All other components within normal limits  CBC  LACTIC ACID, PLASMA  URINALYSIS, ROUTINE W REFLEX MICROSCOPIC  LACTIC ACID, PLASMA  CBG MONITORING, ED   ____________________________________________  EKG  NSR, nml axis, nml intervals, no acute ischemic changes  ____________________________________________  RADIOLOGY Almeta Monas, personally viewed and evaluated these images (plain radiographs) as part of my medical decision making, as well as reviewing the written report by the radiologist.  ED MD interpretation:  I reviewed the CXR which does not  show any acute cardiopulmonary process      ____________________________________________   PROCEDURES  Procedure(s) performed (including Critical Care):  Procedures   ____________________________________________   INITIAL IMPRESSION / ASSESSMENT AND PLAN / ED COURSE     Patient is a 70 year old male who presents with fatigue myalgias cough and shortness of breath.  He tested positive for COVID yesterday urgent care and was prescribed Paxil but has not yet taken it.  They were concerned because his pulse ox at home  was 88%.  Patient sats here are 95% and with ambulation 96%.  He is not in respiratory distress lungs are clear.  Appears fatigued but nontoxic.  Chest x-ray does not show any pneumonia.  His EKG is nonischemic and his blood work including CBC and CMP are reassuring.  I think he is appropriate for outpatient management of his COVID given his normal oxygen saturation.  Discussed return precautions.  He is already prescribed Paxil bid.      ____________________________________________   FINAL CLINICAL IMPRESSION(S) / ED DIAGNOSES  Final diagnoses:  COVID-19 virus infection     ED Discharge Orders     None        Note:  This document was prepared using Dragon voice recognition software and may include unintentional dictation errors.    Rada Hay, MD 12/23/20 2009

## 2020-12-23 NOTE — Discharge Instructions (Signed)
Your chest x-ray did not show any pneumonia and your pulse ox while walking was in normal range.  Please continue to check your oxygen saturation at home.  If it is persistently below 88%, please return to the emergency department.  Please try to stay hydrated and you can take Tylenol and Motrin for discomfort.

## 2021-01-06 DIAGNOSIS — Z86010 Personal history of colonic polyps: Secondary | ICD-10-CM | POA: Diagnosis not present

## 2021-01-19 DIAGNOSIS — R972 Elevated prostate specific antigen [PSA]: Secondary | ICD-10-CM | POA: Diagnosis not present

## 2021-01-27 DIAGNOSIS — Z6828 Body mass index (BMI) 28.0-28.9, adult: Secondary | ICD-10-CM | POA: Diagnosis not present

## 2021-01-27 DIAGNOSIS — Z125 Encounter for screening for malignant neoplasm of prostate: Secondary | ICD-10-CM | POA: Diagnosis not present

## 2021-01-27 DIAGNOSIS — C61 Malignant neoplasm of prostate: Secondary | ICD-10-CM | POA: Diagnosis not present

## 2021-02-19 DIAGNOSIS — L03032 Cellulitis of left toe: Secondary | ICD-10-CM | POA: Diagnosis not present

## 2021-02-19 DIAGNOSIS — M2141 Flat foot [pes planus] (acquired), right foot: Secondary | ICD-10-CM | POA: Diagnosis not present

## 2021-02-19 DIAGNOSIS — M79672 Pain in left foot: Secondary | ICD-10-CM | POA: Diagnosis not present

## 2021-02-19 DIAGNOSIS — E1142 Type 2 diabetes mellitus with diabetic polyneuropathy: Secondary | ICD-10-CM | POA: Diagnosis not present

## 2021-02-19 DIAGNOSIS — D2371 Other benign neoplasm of skin of right lower limb, including hip: Secondary | ICD-10-CM | POA: Diagnosis not present

## 2021-02-19 DIAGNOSIS — M216X1 Other acquired deformities of right foot: Secondary | ICD-10-CM | POA: Diagnosis not present

## 2021-02-19 DIAGNOSIS — L600 Ingrowing nail: Secondary | ICD-10-CM | POA: Diagnosis not present

## 2021-02-19 DIAGNOSIS — M2022 Hallux rigidus, left foot: Secondary | ICD-10-CM | POA: Diagnosis not present

## 2021-02-19 DIAGNOSIS — Q828 Other specified congenital malformations of skin: Secondary | ICD-10-CM | POA: Diagnosis not present

## 2021-02-19 DIAGNOSIS — M21621 Bunionette of right foot: Secondary | ICD-10-CM | POA: Diagnosis not present

## 2021-03-02 DIAGNOSIS — C61 Malignant neoplasm of prostate: Secondary | ICD-10-CM | POA: Diagnosis not present

## 2021-03-02 DIAGNOSIS — Z125 Encounter for screening for malignant neoplasm of prostate: Secondary | ICD-10-CM | POA: Diagnosis not present

## 2021-03-05 DIAGNOSIS — M2022 Hallux rigidus, left foot: Secondary | ICD-10-CM | POA: Diagnosis not present

## 2021-03-05 DIAGNOSIS — E1142 Type 2 diabetes mellitus with diabetic polyneuropathy: Secondary | ICD-10-CM | POA: Diagnosis not present

## 2021-03-05 DIAGNOSIS — M2141 Flat foot [pes planus] (acquired), right foot: Secondary | ICD-10-CM | POA: Diagnosis not present

## 2021-03-05 DIAGNOSIS — L600 Ingrowing nail: Secondary | ICD-10-CM | POA: Diagnosis not present

## 2021-03-05 DIAGNOSIS — C61 Malignant neoplasm of prostate: Secondary | ICD-10-CM | POA: Diagnosis not present

## 2021-03-05 DIAGNOSIS — M216X1 Other acquired deformities of right foot: Secondary | ICD-10-CM | POA: Diagnosis not present

## 2021-03-05 DIAGNOSIS — Z125 Encounter for screening for malignant neoplasm of prostate: Secondary | ICD-10-CM | POA: Diagnosis not present

## 2021-03-05 DIAGNOSIS — M2142 Flat foot [pes planus] (acquired), left foot: Secondary | ICD-10-CM | POA: Diagnosis not present

## 2021-03-05 DIAGNOSIS — M79672 Pain in left foot: Secondary | ICD-10-CM | POA: Diagnosis not present

## 2021-03-05 DIAGNOSIS — M216X2 Other acquired deformities of left foot: Secondary | ICD-10-CM | POA: Diagnosis not present

## 2021-03-05 DIAGNOSIS — M21621 Bunionette of right foot: Secondary | ICD-10-CM | POA: Diagnosis not present

## 2021-03-05 DIAGNOSIS — M21622 Bunionette of left foot: Secondary | ICD-10-CM | POA: Diagnosis not present

## 2021-03-13 ENCOUNTER — Encounter: Payer: Self-pay | Admitting: Emergency Medicine

## 2021-03-13 ENCOUNTER — Other Ambulatory Visit: Payer: Self-pay

## 2021-03-13 ENCOUNTER — Emergency Department
Admission: EM | Admit: 2021-03-13 | Discharge: 2021-03-13 | Disposition: A | Discharge status: Home / Self Care | Payer: Medicare HMO | Attending: Emergency Medicine | Admitting: Emergency Medicine

## 2021-03-13 ENCOUNTER — Emergency Department: Payer: Medicare HMO

## 2021-03-13 DIAGNOSIS — R079 Chest pain, unspecified: Secondary | ICD-10-CM | POA: Diagnosis not present

## 2021-03-13 DIAGNOSIS — R1012 Left upper quadrant pain: Secondary | ICD-10-CM | POA: Diagnosis not present

## 2021-03-13 DIAGNOSIS — R0789 Other chest pain: Secondary | ICD-10-CM | POA: Diagnosis not present

## 2021-03-13 DIAGNOSIS — K2900 Acute gastritis without bleeding: Secondary | ICD-10-CM | POA: Diagnosis not present

## 2021-03-13 DIAGNOSIS — C61 Malignant neoplasm of prostate: Secondary | ICD-10-CM | POA: Diagnosis not present

## 2021-03-13 DIAGNOSIS — J9811 Atelectasis: Secondary | ICD-10-CM | POA: Diagnosis not present

## 2021-03-13 DIAGNOSIS — R1010 Upper abdominal pain, unspecified: Secondary | ICD-10-CM

## 2021-03-13 DIAGNOSIS — R52 Pain, unspecified: Secondary | ICD-10-CM | POA: Diagnosis not present

## 2021-03-13 DIAGNOSIS — Z743 Need for continuous supervision: Secondary | ICD-10-CM | POA: Diagnosis not present

## 2021-03-13 DIAGNOSIS — R61 Generalized hyperhidrosis: Secondary | ICD-10-CM | POA: Diagnosis not present

## 2021-03-13 LAB — CBC WITH DIFFERENTIAL/PLATELET
Abs Immature Granulocytes: 0.02 10*3/uL (ref 0.00–0.07)
Basophils Absolute: 0.1 10*3/uL (ref 0.0–0.1)
Basophils Relative: 1 %
Eosinophils Absolute: 0.2 10*3/uL (ref 0.0–0.5)
Eosinophils Relative: 3 %
HCT: 45.8 % (ref 39.0–52.0)
Hemoglobin: 14.9 g/dL (ref 13.0–17.0)
Immature Granulocytes: 0 %
Lymphocytes Relative: 29 %
Lymphs Abs: 2.3 10*3/uL (ref 0.7–4.0)
MCH: 29.5 pg (ref 26.0–34.0)
MCHC: 32.5 g/dL (ref 30.0–36.0)
MCV: 90.7 fL (ref 80.0–100.0)
Monocytes Absolute: 0.9 10*3/uL (ref 0.1–1.0)
Monocytes Relative: 12 %
Neutro Abs: 4.4 10*3/uL (ref 1.7–7.7)
Neutrophils Relative %: 55 %
Platelets: 253 10*3/uL (ref 150–400)
RBC: 5.05 MIL/uL (ref 4.22–5.81)
RDW: 13 % (ref 11.5–15.5)
WBC: 7.9 10*3/uL (ref 4.0–10.5)
nRBC: 0 % (ref 0.0–0.2)

## 2021-03-13 LAB — COMPREHENSIVE METABOLIC PANEL
ALT: 29 U/L (ref 0–44)
AST: 23 U/L (ref 15–41)
Albumin: 4.4 g/dL (ref 3.5–5.0)
Alkaline Phosphatase: 68 U/L (ref 38–126)
Anion gap: 8 (ref 5–15)
BUN: 20 mg/dL (ref 8–23)
CO2: 25 mmol/L (ref 22–32)
Calcium: 10 mg/dL (ref 8.9–10.3)
Chloride: 105 mmol/L (ref 98–111)
Creatinine, Ser: 0.97 mg/dL (ref 0.61–1.24)
GFR, Estimated: >60 mL/min (ref >60)
Glucose, Bld: 98 mg/dL (ref 70–99)
Potassium: 3.9 mmol/L (ref 3.5–5.1)
Sodium: 138 mmol/L (ref 135–145)
Total Bilirubin: 0.7 mg/dL (ref 0.3–1.2)
Total Protein: 7.5 g/dL (ref 6.5–8.1)

## 2021-03-13 LAB — TROPONIN I (HIGH SENSITIVITY)
Troponin I (High Sensitivity): 3 ng/L (ref <18)
Troponin I (High Sensitivity): 3 ng/L (ref <18)

## 2021-03-13 LAB — LIPASE, BLOOD: Lipase: 30 U/L (ref 11–51)

## 2021-03-13 MED ORDER — METOCLOPRAMIDE HCL 10 MG PO TABS: 10.0000 mg | ORAL_TABLET | Freq: Four times a day (QID) | ORAL | 0 refills | Status: DC | PRN

## 2021-03-13 MED ORDER — SUCRALFATE 1 G PO TABS
1.0000 g | ORAL_TABLET | Freq: Once | ORAL | Status: AC
  Administered 2021-03-13: 1 g via ORAL
  Filled 2021-03-13: qty 1

## 2021-03-13 MED ORDER — PANTOPRAZOLE SODIUM 40 MG IV SOLR
40.0000 mg | Freq: Once | INTRAVENOUS | Status: AC
  Administered 2021-03-13: 40 mg via INTRAVENOUS
  Filled 2021-03-13: qty 10

## 2021-03-13 MED ORDER — FAMOTIDINE 20 MG PO TABS: 20.0000 mg | ORAL_TABLET | Freq: Two times a day (BID) | ORAL | 0 refills | Status: DC

## 2021-03-13 MED ORDER — METOCLOPRAMIDE HCL 5 MG/ML IJ SOLN
10.0000 mg | INTRAMUSCULAR | Status: AC
  Administered 2021-03-13: 10 mg via INTRAVENOUS
  Filled 2021-03-13: qty 2

## 2021-03-13 MED ORDER — SODIUM CHLORIDE 0.9 % IV BOLUS
1000.0000 mL | Freq: Once | INTRAVENOUS | Status: AC
  Administered 2021-03-13: 1000 mL via INTRAVENOUS

## 2021-03-13 MED ORDER — SUCRALFATE 1 G PO TABS: 1.0000 g | ORAL_TABLET | Freq: Four times a day (QID) | ORAL | 0 refills | Status: DC

## 2021-03-13 NOTE — ED Notes (Signed)
Pt states after receiving aspirin and nitro spray from EMS he states his chest pain is a lot better.  ?

## 2021-03-13 NOTE — ED Triage Notes (Signed)
Pt coming via [facility]. Per EMS, pt was sitting in his car and had onset Left-sided chest pain that radiated under his ribs. Pt stated he go out the car to see if chest pain would subside but pt got no relief. EMS gave pt '324mg'$  of Aspirin and 1 Spray of Nitro and pt's chest pain went from an 8 to a 4 on a 0-10 pain scale.  ? ? ?

## 2021-03-13 NOTE — ED Notes (Signed)
Pt stated he feels a lot better and his chest pain has subsided.

## 2021-03-13 NOTE — ED Provider Notes (Signed)
? ?[facility] ?Provider Note ? ? ? Event Date/Time  ? First MD Initiated Contact with Patient 03/13/21 1633   ?  (approximate) ? ? ?History  ? ?Chest Pain ? ? ?HPI ? ?LADERRICK WILK is a 71 y.o. male with a history of hyperlipidemia who comes ED complaining of left upper abdominal pain that radiates to upper abdominal area, started suddenly while sitting in his car this afternoon around 3:30 PM.  Constant, worsening.  Associated with a feeling of lightheadedness.  No syncope.  No shortness of breath or vomiting, but he does report some sweating.  He called EMS who gave him 324 of aspirin and nitroglycerin spray, after which his pain decreased from a 9-10 out of 10. ?He does report he has been having increased GERD symptoms lately and is trying to modify his Diet. ?  ? ? ?Physical Exam  ? ?Triage Vital Signs: ?ED Triage Vitals  ?Enc Vitals Group  ?   BP 03/13/21 1628 (!) 120/55  ?   Pulse Rate 03/13/21 1628 65  ?   Resp 03/13/21 1628 20  ?   Temp 03/13/21 1628 98.3 ?F (36.8 ?C)  ?   Temp Source 03/13/21 1628 Oral  ?   SpO2 03/13/21 1620 100 %  ?   Weight 03/13/21 1625 195 lb (88.5 kg)  ?   Height 03/13/21 1625 '5\' 10"'$  (1.778 m)  ?   Head Circumference --   ?   Peak Flow --   ?   Pain Score 03/13/21 1625 4  ?   Pain Loc --   ?   Pain Edu? --   ?   Excl. in [place]? --   ? ? ?Most recent vital signs: ?Vitals:  ? 03/13/21 1730 03/13/21 1900  ?BP: 134/67 122/61  ?Pulse: 64 64  ?Resp: (!) 23 19  ?Temp:    ?SpO2: 98% 97%  ? ? ? ?General: Awake, no distress.  ?CV:  Good peripheral perfusion.  Regular rate and rhythm ?Resp:  Normal effort.  Clear to auscultation bilaterally ?Abd:  No distention.  There is diffuse upper abdominal tenderness.  Lower abdomen nontender.  Abdomen is soft, nonperitoneal ?Other:  , No calf tenderness. ? ? ?ED Results / Procedures / Treatments  ? ?Labs ?(all labs ordered are listed, but only abnormal results are displayed) ?Labs Reviewed  ?COMPREHENSIVE METABOLIC PANEL  ?LIPASE,  BLOOD  ?CBC WITH DIFFERENTIAL/PLATELET  ?TROPONIN I (HIGH SENSITIVITY)  ?TROPONIN I (HIGH SENSITIVITY)  ? ? ? ?EKG ? ?Interpreted by me ?Normal sinus rhythm rate of 69.  Normal axis normal intervals of PR QRS and ST segments.  No ischemic changes ? ? ?RADIOLOGY ?Chest x-ray viewed and interpreted by me, appears normal.  Shallow inspiration during the study.  No pneumothorax consolidation. ? ? ? ?PROCEDURES: ? ?Critical Care performed: No ? ?Procedures ? ? ?MEDICATIONS ORDERED IN ED: ?Medications  ?sodium chloride 0.9 % bolus 1,000 mL (0 mLs Intravenous Stopped 03/13/21 1904)  ?pantoprazole (PROTONIX) injection 40 mg (40 mg Intravenous Given 03/13/21 1738)  ?sucralfate (CARAFATE) tablet 1 g (1 g Oral Given 03/13/21 1740)  ?metoCLOPramide (REGLAN) injection 10 mg (10 mg Intravenous Given 03/13/21 1739)  ? ? ? ?IMPRESSION / MDM / ASSESSMENT AND PLAN / ED COURSE  ?I reviewed the triage vital signs and the nursing notes. ?             ?               ? ?  Differential diagnosis includes, but is not limited to, GERD, pancreatitis, cholecystitis, ? ? ?**The patient is on the cardiac monitor to evaluate for evidence of arrhythmia and/or significant heart rate changes.**} ? ?Patient presents with severe upper abdominal pain, nonradiating.  Doubt ACS, PE, dissection, carditis. Will check labs and give antacids and reassess. ? ?Clinical Course as of 03/13/21 2021  ?Sat Mar 13, 2021  ?1857 Pt reports sx have resolved. Abd is soft, nontender to palp. Will repeat trop, and if delta is negative, DC on antacids, which he will hold during the prep period of his upcoming colonoscopy. [PS]  ?2021 Repeat troponin normal [PS]  ?  ?Clinical Course User Index ?[PS] Carrie Mew, MD  ? ? ? ?FINAL CLINICAL IMPRESSION(S) / ED DIAGNOSES  ? ?Final diagnoses:  ?Upper abdominal pain  ?Acute gastritis without hemorrhage, unspecified gastritis type  ? ? ? ?Rx / DC Orders  ? ?ED Discharge Orders   ? ?      Ordered  ?  sucralfate (CARAFATE) 1 g  tablet  4 times daily       ? 03/13/21 1932  ?  famotidine (PEPCID) 20 MG tablet  2 times daily       ? 03/13/21 1932  ?  metoCLOPramide (REGLAN) 10 MG tablet  Every 6 hours PRN       ? 03/13/21 1932  ? ?  ?  ? ?  ? ? ? ?Note:  This document was prepared using Dragon voice recognition software and may include unintentional dictation errors. ?  ?Carrie Mew, MD ?03/13/21 1933 ? ?

## 2021-03-15 ENCOUNTER — Encounter: Payer: Self-pay | Admitting: *Deleted

## 2021-03-16 ENCOUNTER — Ambulatory Visit: Payer: Medicare HMO | Admitting: Anesthesiology

## 2021-03-16 ENCOUNTER — Encounter: Payer: Self-pay | Admitting: *Deleted

## 2021-03-16 ENCOUNTER — Ambulatory Visit
Admission: RE | Admit: 2021-03-16 | Discharge: 2021-03-16 | Disposition: A | Discharge status: Home / Self Care | Payer: Medicare HMO | Attending: Gastroenterology | Admitting: Gastroenterology

## 2021-03-16 ENCOUNTER — Encounter
Admission: RE | Disposition: A | Discharge status: Home / Self Care | Payer: Self-pay | Source: Home / Self Care | Attending: Gastroenterology

## 2021-03-16 DIAGNOSIS — Z09 Encounter for follow-up examination after completed treatment for conditions other than malignant neoplasm: Secondary | ICD-10-CM | POA: Insufficient documentation

## 2021-03-16 DIAGNOSIS — K635 Polyp of colon: Secondary | ICD-10-CM | POA: Diagnosis not present

## 2021-03-16 DIAGNOSIS — Z79899 Other long term (current) drug therapy: Secondary | ICD-10-CM | POA: Diagnosis not present

## 2021-03-16 DIAGNOSIS — K5730 Diverticulosis of large intestine without perforation or abscess without bleeding: Secondary | ICD-10-CM | POA: Insufficient documentation

## 2021-03-16 DIAGNOSIS — J45909 Unspecified asthma, uncomplicated: Secondary | ICD-10-CM | POA: Insufficient documentation

## 2021-03-16 DIAGNOSIS — E119 Type 2 diabetes mellitus without complications: Secondary | ICD-10-CM | POA: Diagnosis not present

## 2021-03-16 DIAGNOSIS — K640 First degree hemorrhoids: Secondary | ICD-10-CM | POA: Insufficient documentation

## 2021-03-16 DIAGNOSIS — E785 Hyperlipidemia, unspecified: Secondary | ICD-10-CM | POA: Diagnosis not present

## 2021-03-16 DIAGNOSIS — D122 Benign neoplasm of ascending colon: Secondary | ICD-10-CM | POA: Insufficient documentation

## 2021-03-16 DIAGNOSIS — Z1211 Encounter for screening for malignant neoplasm of colon: Secondary | ICD-10-CM | POA: Diagnosis not present

## 2021-03-16 DIAGNOSIS — Z8249 Family history of ischemic heart disease and other diseases of the circulatory system: Secondary | ICD-10-CM | POA: Insufficient documentation

## 2021-03-16 DIAGNOSIS — Z86010 Personal history of colonic polyps: Secondary | ICD-10-CM | POA: Diagnosis not present

## 2021-03-16 HISTORY — PX: COLONOSCOPY WITH PROPOFOL: SHX5780

## 2021-03-16 HISTORY — DX: Allergy status to unspecified drugs, medicaments and biological substances: Z88.9

## 2021-03-16 HISTORY — DX: Unspecified asthma, uncomplicated: J45.909

## 2021-03-16 SURGERY — COLONOSCOPY WITH PROPOFOL
Anesthesia: General

## 2021-03-16 MED ORDER — PROPOFOL 500 MG/50ML IV EMUL
INTRAVENOUS | Status: DC | PRN
  Administered 2021-03-16: 175 ug/kg/min via INTRAVENOUS

## 2021-03-16 MED ORDER — PROPOFOL 10 MG/ML IV BOLUS
INTRAVENOUS | Status: AC
  Filled 2021-03-16: qty 40

## 2021-03-16 MED ORDER — STERILE WATER FOR IRRIGATION IR SOLN
Status: DC | PRN
  Administered 2021-03-16: 60 mL

## 2021-03-16 MED ORDER — PROPOFOL 10 MG/ML IV BOLUS
INTRAVENOUS | Status: DC | PRN
  Administered 2021-03-16: 70 mg via INTRAVENOUS

## 2021-03-16 MED ORDER — SODIUM CHLORIDE 0.9 % IV SOLN
INTRAVENOUS | Status: DC
  Administered 2021-03-16: 1000 mL via INTRAVENOUS

## 2021-03-16 NOTE — Anesthesia Preprocedure Evaluation (Addendum)
Anesthesia Evaluation  ?Patient identified by MRN, date of birth, ID band ?Patient awake ? ? ? ?Reviewed: ?Allergy & Precautions, NPO status , Patient's Chart, lab work & pertinent test results ? ?History of Anesthesia Complications ?Negative for: history of anesthetic complications ? ?Airway ?Mallampati: II ? ? ?Neck ROM: Full ? ? ? Dental ?no notable dental hx. ? ?  ?Pulmonary ?asthma ,  ?  ?Pulmonary exam normal ?breath sounds clear to auscultation ? ? ? ? ? ? Cardiovascular ?Exercise Tolerance: Good ?Normal cardiovascular exam ?Rhythm:Regular Rate:Normal ? ?ECG 03/13/21:  ?Sinus rhythm ?Low voltage, extremity and precordial leads ?  ?Neuro/Psych ?negative neurological ROS ?   ? GI/Hepatic ?negative GI ROS,   ?Endo/Other  ?diabetes, Type 2 ? Renal/GU ?negative Renal ROS  ? ?Prostate CA ? ?  ?Musculoskeletal ? ? Abdominal ?  ?Peds ? Hematology ?negative hematology ROS ?(+)   ?Anesthesia Other Findings ?Cardiology note 08/17/20:  ?1. Family hx of premature CAD ?-he is asymptomatic from a cardiac standpoint ?-his father and paternal GF had CAD ?-his CRFs include HLD and DM2 ?-EKG is nonischemic ?-I will get a coronary Ca score ?-If coronary Ca score > 600 then will recommend Stress myoview ?-Shared Decision Making/Informed Consent ?The risks (chest pain, shortness of breath, cardiac arrhythmias, dizziness, blood pressure fluctuations, myocardial infarction, stroke/transient ischemic attack, nausea, vomiting, allergic reaction, radiation exposure, metallic taste sensation and life-threatening complications (estimated to be 1 in 10,000)), benefits (risk stratification, diagnosing coronary artery disease, treatment guidance) and alternatives of a nuclear stress test were discussed in detail with Frank Wells and he agrees to proceed. ?? ?2.  HLD ?-followed by PCP ?-LDL was 64 in Sept 2021 on review of labs from PCP ?-continue Crestor '10mg'$  daily ?? ?3.  Type 2 DM ?-followed by PCP ?-HbA1C  was 5.9% in March 2022 ?-diet controlled ? Reproductive/Obstetrics ? ?  ? ? ? ? ? ? ? ? ? ? ? ? ? ?  ?  ? ? ? ? ? ? ? ?Anesthesia Physical ?Anesthesia Plan ? ?ASA: 2 ? ?Anesthesia Plan: General  ? ?Post-op Pain Management:   ? ?Induction: Intravenous ? ?PONV Risk Score and Plan: 2 and Propofol infusion, TIVA and Treatment may vary due to age or medical condition ? ?Airway Management Planned: Natural Airway ? ?Additional Equipment:  ? ?Intra-op Plan:  ? ?Post-operative Plan:  ? ?Informed Consent: I have reviewed the patients History and Physical, chart, labs and discussed the procedure including the risks, benefits and alternatives for the proposed anesthesia with the patient or authorized representative who has indicated his/her understanding and acceptance.  ? ? ? ? ? ?Plan Discussed with: CRNA ? ?Anesthesia Plan Comments: (LMA/GETA backup discussed.  Patient consented for risks of anesthesia including but not limited to:  ?- adverse reactions to medications ?- damage to eyes, teeth, lips or other oral mucosa ?- nerve damage due to positioning  ?- sore throat or hoarseness ?- damage to heart, brain, nerves, lungs, other parts of body or loss of life ? ?Informed patient about role of CRNA in peri- and intra-operative care.  Patient voiced understanding.)  ? ? ? ? ? ? ?Anesthesia Quick Evaluation ? ?

## 2021-03-16 NOTE — Transfer of Care (Signed)
Immediate Anesthesia Transfer of Care Note ? ?Patient: Frank Wells ? ?Procedure(s) Performed: COLONOSCOPY WITH PROPOFOL ? ?Patient Location: Endoscopy Unit ? ?Anesthesia Type:General ? ?Level of Consciousness: drowsy ? ?Airway & Oxygen Therapy: Patient Spontanous Breathing ? ?Post-op Assessment: Report given to RN and Post -op Vital signs reviewed and stable ? ?Post vital signs: Reviewed and stable ? ?Last Vitals:  ?Vitals Value Taken Time  ?BP 97/54 03/16/21 1328  ?Temp 36.9 ?C 03/16/21 1328  ?Pulse 70 03/16/21 1329  ?Resp 15 03/16/21 1329  ?SpO2 99 % 03/16/21 1329  ?Vitals shown include unvalidated device data. ? ?Last Pain:  ?Vitals:  ? 03/16/21 1328  ?TempSrc: Temporal  ?PainSc:   ?   ? ?  ? ?Complications: No notable events documented. ?

## 2021-03-16 NOTE — Op Note (Signed)
[facility] ?Gastroenterology ?Patient Name: Frank Wells ?Procedure Date: 03/16/2021 12:54 PM ?MRN: [id number] ?Account #: [id number] ?Date of Birth: [birth year] ?Admit Type: Outpatient ?Age: 71 ?Room: [facility] ENDO ROOM 1 ?Gender: Male ?Note Status: Finalized ?Instrument Name: Colonscope [id number] ?Procedure:             Colonoscopy ?Indications:           Surveillance: Personal history of adenomatous polyps  ?                       on last colonoscopy > 3 years ago ?Providers:             Andrey Farmer MD, MD ?Referring MD:          Judithann Sauger MD, MD (Referring MD) ?Medicines:             Monitored Anesthesia Care ?Complications:         No immediate complications. Estimated blood loss:  ?                       Minimal. ?Procedure:             Pre-Anesthesia Assessment: ?                       - Prior to the procedure, a History and Physical was  ?                       performed, and patient medications and allergies were  ?                       reviewed. The patient is competent. The risks and  ?                       benefits of the procedure and the sedation options and  ?                       risks were discussed with the patient. All questions  ?                       were answered and informed consent was obtained.  ?                       Patient identification and proposed procedure were  ?                       verified by the physician, the nurse, the  ?                       anesthesiologist, the anesthetist and the technician  ?                       in the endoscopy suite. Mental Status Examination:  ?                       alert and oriented. Airway Examination: normal  ?                       oropharyngeal airway and neck mobility. Respiratory  ?  Examination: clear to auscultation. CV Examination:  ?                       normal. Prophylactic Antibiotics: The patient does not  ?                       require prophylactic antibiotics. Prior  ?                        Anticoagulants: The patient has taken no previous  ?                       anticoagulant or antiplatelet agents. ASA Grade  ?                       Assessment: II - A patient with mild systemic disease.  ?                       After reviewing the risks and benefits, the patient  ?                       was deemed in satisfactory condition to undergo the  ?                       procedure. The anesthesia plan was to use monitored  ?                       anesthesia care (MAC). Immediately prior to  ?                       administration of medications, the patient was  ?                       re-assessed for adequacy to receive sedatives. The  ?                       heart rate, respiratory rate, oxygen saturations,  ?                       blood pressure, adequacy of pulmonary ventilation, and  ?                       response to care were monitored throughout the  ?                       procedure. The physical status of the patient was  ?                       re-assessed after the procedure. ?                       After obtaining informed consent, the colonoscope was  ?                       passed under direct vision. Throughout the procedure,  ?                       the patient's blood pressure, pulse, and oxygen  ?  saturations were monitored continuously. The  ?                       Colonoscope was introduced through the anus and  ?                       advanced to the the cecum, identified by appendiceal  ?                       orifice and ileocecal valve. The colonoscopy was  ?                       somewhat difficult due to significant looping.  ?                       Successful completion of the procedure was aided by  ?                       applying abdominal pressure. The patient tolerated the  ?                       procedure well. The quality of the bowel preparation  ?                       was adequate to identify polyps. ?Findings: ?     The perianal and  digital rectal examinations were normal. ?     A 3 mm polyp was found in the ascending colon. The polyp was sessile.  ?     The polyp was removed with a cold snare. Resection and retrieval were  ?     complete. Estimated blood loss was minimal. ?     Multiple small-mouthed diverticula were found in the sigmoid colon. ?     Internal hemorrhoids were found during retroflexion. The hemorrhoids  ?     were Grade I (internal hemorrhoids that do not prolapse). ?     The exam was otherwise without abnormality on direct and retroflexion  ?     views. ?Impression:            - One 3 mm polyp in the ascending colon, removed with  ?                       a cold snare. Resected and retrieved. ?                       - Diverticulosis in the sigmoid colon. ?                       - Internal hemorrhoids. ?                       - The examination was otherwise normal on direct and  ?                       retroflexion views. ?Recommendation:        - Discharge patient to home. ?                       - Resume previous diet. ?                       -  Continue present medications. ?                       - Await pathology results. ?                       - Repeat colonoscopy in 5 years for surveillance. ?                       - Return to referring physician as previously  ?                       scheduled. ?Procedure Code(s):     --- Professional --- ?                       [phone], Colonoscopy, flexible; with removal of  ?                       tumor(s), polyp(s), or other lesion(s) by snare  ?                       technique ?Diagnosis Code(s):     --- Professional --- ?                       Z86.010, Personal history of colonic polyps ?                       K63.5, Polyp of colon ?                       K64.0, First degree hemorrhoids ?                       K57.30, Diverticulosis of large intestine without  ?                       perforation or abscess without bleeding ?CPT copyright 2019 [employer]. All rights  reserved. ?The codes documented in this report are preliminary and upon coder review may  ?be revised to meet current compliance requirements. ?Andrey Farmer MD, MD ?03/16/2021 1:25:59 PM ?Number of Addenda: 0 ?Note Initiated On: 03/16/2021 12:54 PM ?Scope Withdrawal Time: 0 hours 12 minutes 10 seconds  ?Total Procedure Duration: 0 hours 17 minutes 54 seconds  ?Estimated Blood Loss:  Estimated blood loss was minimal. ?     [facility] ?

## 2021-03-16 NOTE — H&P (Signed)
Outpatient short stay form Pre-procedure ?03/16/2021  ?Lesly Rubenstein, MD ? ?Primary Physician: Carol Ada, MD ? ?Reason for visit:  Surveillance colonoscopy ? ?History of present illness:   ? ?71 y/o gentleman with history of HLD here for surveillance colonoscopy. Last colonoscopy was in 2019 with 3 small Ta's. No blood thinners. No family history of GI malignancies. No significant abdominal surgeries. Was recently in ER for epigastric pain, ACS ruled out. ? ? ? ?Current Facility-Administered Medications:  ?  0.9 %  sodium chloride infusion, , Intravenous, Continuous, Keyshawna Prouse, Hilton Cork, MD, Last Rate: 20 mL/hr at 03/16/21 1256, Continued from Pre-op at 03/16/21 1256 ? ?Medications Prior to Admission  ?Medication Sig Dispense Refill Last Dose  ? sucralfate (CARAFATE) 1 g tablet Take 1 tablet (1 g total) by mouth 4 (four) times daily for 15 days. 60 tablet 0 Past Week  ? albuterol (PROVENTIL HFA;VENTOLIN HFA) 108 (90 Base) MCG/ACT inhaler Inhale 2 puffs into the lungs every 6 (six) hours as needed for wheezing or shortness of breath. (Patient not taking: Reported on 03/13/2021)     ? diclofenac (VOLTAREN) 75 MG EC tablet Take 1 tablet (75 mg total) by mouth 2 (two) times daily as needed. (Patient not taking: Reported on 03/13/2021) 60 tablet 1   ? diclofenac Sodium (VOLTAREN) 1 % GEL Apply 2 g topically 4 (four) times daily. (Patient not taking: Reported on 03/13/2021) 150 g 0   ? famotidine (PEPCID) 20 MG tablet Take 1 tablet (20 mg total) by mouth 2 (two) times daily for 15 days. 30 tablet 0   ? meloxicam (MOBIC) 7.5 MG tablet Take 1 tablet (7.5 mg total) by mouth 2 (two) times daily as needed for pain. (Patient not taking: Reported on 03/13/2021) 30 tablet 2   ? metoCLOPramide (REGLAN) 10 MG tablet Take 1 tablet (10 mg total) by mouth every 6 (six) hours as needed. 30 tablet 0   ? predniSONE (STERAPRED UNI-PAK 21 TAB) 10 MG (21) TBPK tablet Take as directed (Patient not taking: Reported on 03/13/2021) 21  tablet 0   ? rosuvastatin (CRESTOR) 10 MG tablet Take 10 mg by mouth daily.   03/12/2021  ? ? ? ?Allergies  ?Allergen Reactions  ? Codeine Shortness Of Breath  ? ? ? ?Past Medical History:  ?Diagnosis Date  ? Agatston coronary artery calcium score less than 100   ? 70.8 Ca score CT 08/2020  ? Allergic genetic state   ? Asthma   ? [facility])   ? Prostate  ? Chronic cough   ? Diabetes mellitus without complication ([place])   ? diet controlled  ? Hyperlipidemia   ? Inguinal hernia   ? Umbilical hernia   ? ? ?Review of systems:  Otherwise negative.  ? ? ?Physical Exam ? ?Gen: Alert, oriented. Appears stated age.  ?HEENT: PERRLA. ?Lungs: No respiratory distress ?CV: RRR ?Abd: soft, benign, no masses ?Ext: No edema ? ? ? ?Planned procedures: Proceed with colonoscopy. The patient understands the nature of the planned procedure, indications, risks, alternatives and potential complications including but not limited to bleeding, infection, perforation, damage to internal organs and possible oversedation/side effects from anesthesia. The patient agrees and gives consent to proceed.  ?Please refer to procedure notes for findings, recommendations and patient disposition/instructions.  ? ? ? ?Lesly Rubenstein, MD ?[facility] ? ? ? ?  ? ?

## 2021-03-16 NOTE — Interval H&P Note (Signed)
History and Physical Interval Note: ? ?03/16/2021 ?12:59 PM ? ?Frank Wells  has presented today for surgery, with the diagnosis of PH Colonic Polyps.  The various methods of treatment have been discussed with the patient and family. After consideration of risks, benefits and other options for treatment, the patient has consented to  Procedure(s): ?COLONOSCOPY WITH PROPOFOL (N/A) as a surgical intervention.  The patient's history has been reviewed, patient examined, no change in status, stable for surgery.  I have reviewed the patient's chart and labs.  Questions were answered to the patient's satisfaction.   ? ? ?Hilton Cork Kajuana Shareef ? ?Ok to proceed with colonoscopy ?

## 2021-03-17 ENCOUNTER — Encounter: Payer: Self-pay | Admitting: Gastroenterology

## 2021-03-17 LAB — SURGICAL PATHOLOGY

## 2021-03-17 NOTE — Progress Notes (Signed)
Pt states he is concerned about the care he received. Has written a note to administration. ?

## 2021-03-17 NOTE — Anesthesia Postprocedure Evaluation (Signed)
Anesthesia Post Note ? ?Patient: Frank Wells ? ?Procedure(s) Performed: COLONOSCOPY WITH PROPOFOL ? ?Patient location during evaluation: PACU ?Anesthesia Type: General ?Level of consciousness: awake and alert, oriented and patient cooperative ?Pain management: pain level controlled ?Vital Signs Assessment: post-procedure vital signs reviewed and stable ?Respiratory status: spontaneous breathing, nonlabored ventilation and respiratory function stable ?Cardiovascular status: blood pressure returned to baseline and stable ?Postop Assessment: adequate PO intake ?Anesthetic complications: no ? ? ?No notable events documented. ? ? ?Last Vitals:  ?Vitals:  ? 03/16/21 1329 03/16/21 1348  ?BP:  (!) 115/55  ?Pulse: 68   ?Resp: 15   ?Temp:    ?SpO2: 99%   ?  ?Last Pain:  ?Vitals:  ? 03/17/21 0733  ?TempSrc:   ?PainSc: 0-No pain  ? ? ?  ?  ?  ?  ?  ?  ? ?Darrin Nipper ? ? ? ? ?

## 2021-04-27 DIAGNOSIS — T691XXA Chilblains, initial encounter: Secondary | ICD-10-CM | POA: Diagnosis not present

## 2021-04-27 DIAGNOSIS — L720 Epidermal cyst: Secondary | ICD-10-CM | POA: Diagnosis not present

## 2021-04-27 DIAGNOSIS — L570 Actinic keratosis: Secondary | ICD-10-CM | POA: Diagnosis not present

## 2021-04-27 DIAGNOSIS — Z85828 Personal history of other malignant neoplasm of skin: Secondary | ICD-10-CM | POA: Diagnosis not present

## 2021-04-27 DIAGNOSIS — L821 Other seborrheic keratosis: Secondary | ICD-10-CM | POA: Diagnosis not present

## 2021-04-27 DIAGNOSIS — L820 Inflamed seborrheic keratosis: Secondary | ICD-10-CM | POA: Diagnosis not present

## 2021-04-27 DIAGNOSIS — D1801 Hemangioma of skin and subcutaneous tissue: Secondary | ICD-10-CM | POA: Diagnosis not present

## 2021-05-18 DIAGNOSIS — I700 Atherosclerosis of aorta: Secondary | ICD-10-CM | POA: Diagnosis not present

## 2021-05-18 DIAGNOSIS — E7800 Pure hypercholesterolemia, unspecified: Secondary | ICD-10-CM | POA: Diagnosis not present

## 2021-05-18 DIAGNOSIS — I75022 Atheroembolism of left lower extremity: Secondary | ICD-10-CM | POA: Diagnosis not present

## 2021-05-18 DIAGNOSIS — E1169 Type 2 diabetes mellitus with other specified complication: Secondary | ICD-10-CM | POA: Diagnosis not present

## 2021-05-19 DIAGNOSIS — R1013 Epigastric pain: Secondary | ICD-10-CM | POA: Diagnosis not present

## 2021-05-19 DIAGNOSIS — K319 Disease of stomach and duodenum, unspecified: Secondary | ICD-10-CM | POA: Diagnosis not present

## 2021-06-01 DIAGNOSIS — C61 Malignant neoplasm of prostate: Secondary | ICD-10-CM | POA: Diagnosis not present

## 2021-06-01 DIAGNOSIS — Z125 Encounter for screening for malignant neoplasm of prostate: Secondary | ICD-10-CM | POA: Diagnosis not present

## 2021-06-04 DIAGNOSIS — Z125 Encounter for screening for malignant neoplasm of prostate: Secondary | ICD-10-CM | POA: Diagnosis not present

## 2021-06-08 ENCOUNTER — Other Ambulatory Visit: Payer: Self-pay | Admitting: *Deleted

## 2021-06-08 DIAGNOSIS — I75029 Atheroembolism of unspecified lower extremity: Secondary | ICD-10-CM

## 2021-06-17 ENCOUNTER — Ambulatory Visit (HOSPITAL_COMMUNITY)
Admission: RE | Admit: 2021-06-17 | Discharge: 2021-06-17 | Disposition: A | Discharge status: Home / Self Care | Payer: Medicare HMO | Source: Ambulatory Visit | Attending: Vascular Surgery | Admitting: Vascular Surgery

## 2021-06-17 ENCOUNTER — Encounter: Payer: Self-pay | Admitting: Vascular Surgery

## 2021-06-17 ENCOUNTER — Ambulatory Visit: Payer: Medicare HMO | Admitting: Vascular Surgery

## 2021-06-17 VITALS — BP 121/76 | HR 59 | Temp 98.3°F | Resp 20 | Ht 70.0 in | Wt 193.0 lb

## 2021-06-17 DIAGNOSIS — I75022 Atheroembolism of left lower extremity: Secondary | ICD-10-CM

## 2021-06-17 DIAGNOSIS — I75029 Atheroembolism of unspecified lower extremity: Secondary | ICD-10-CM | POA: Diagnosis not present

## 2021-06-17 NOTE — Progress Notes (Signed)
ASSESSMENT & PLAN   POSSIBLE BLUE TOE SYNDROME: This patient noted some discoloration of the left fourth toe about 3 to 4 months ago.  He does not remember a specific sudden onset of discoloration or symptoms in the toe.  No other specific etiology for this has been found.  He has no evidence of arterial insufficiency with palpable pedal pulses and a normal noninvasive study.  However certainly embolization would be in the differential diagnosis and I have recommended that we consider arteriography to look for potential source of embolization.  I have discussed the indications for the procedure and the potential complications.  He would prefer to hold off on arteriography.  I have instructed him that if he develops the sudden discoloration of another toe or any new symptoms we should definitely reconsider arteriography.  I have also instructed him to begin taking aspirin daily.  If he changes his mind and ultimately would like to proceed with arteriography and then certainly we can arrange this.  I have discussed the indications for the procedure and the potential complications including a 2 to 3% risk of arterial injury or bleeding.  REASON FOR CONSULT:    Blue toe syndrome left lower extremity.  The consult is requested by Dr. Antony Wells.  HPI:   Frank Wells is a 71 y.o. male who is referred with possible blue toe syndrome.  I have reviewed the records from the referring office.  The patient has a history of diabetes mellitus, prostate cancer, BPH, atherosclerosis of the aorta, and elevated LDL cholesterol.  He was seen on 05/18/2021 and reported at that time some purplish discoloration in the left fourth toe.  This has been going on for 4 months.  He reported some numbness at the tip of the toe.  He had been seen by podiatrist and x-rays were unremarkable.  Of note he has had COVID twice.  At the time of his most recent visit his creatinine was normal at 0.89.  His GFR was 93.  On my history,  the patient noted some discoloration of the tip of the left fourth toe 3 to 4 months ago.  He does not remember that this appeared suddenly.  He did not notice the sudden onset of pain in his toe.  He cannot remember any specific injury to the toe.  He denies any history of claudication, rest pain, or nonhealing ulcers.  He has had COVID twice.  He is not on aspirin but does take a statin.  Past Medical History:  Diagnosis Date   Agatston coronary artery calcium score less than 100    70.8 Ca score CT 08/2020   Allergic genetic state    Asthma    Cancer ([place])    Prostate   Chronic cough    Diabetes mellitus without complication (H[facility])    diet controlled   Hyperlipidemia    Inguinal hernia    Umbilical hernia     Family History  Problem Relation Age of Onset   CAD Father        CABG with redo   Heart attack Paternal Grandfather     SOCIAL HISTORY: Social History   Tobacco Use   Smoking status: Never   Smokeless tobacco: Never  Substance Use Topics   Alcohol use: No    Allergies  Allergen Reactions   Codeine Shortness Of Breath    Current Outpatient Medications  Medication Sig Dispense Refill   albuterol (PROVENTIL HFA;VENTOLIN HFA) 108 (90 Base) MCG/ACT  inhaler Inhale 2 puffs into the lungs every 6 (six) hours as needed for wheezing or shortness of breath.     pantoprazole (PROTONIX) 40 MG tablet Take 40 mg by mouth daily.     rosuvastatin (CRESTOR) 10 MG tablet Take 10 mg by mouth daily.     No current facility-administered medications for this visit.    REVIEW OF SYSTEMS:  '[X]'$  denotes positive finding, '[ ]'$  denotes negative finding Cardiac  Comments:  Chest pain or chest pressure:    Shortness of breath upon exertion:    Short of breath when lying flat:    Irregular heart rhythm:        Vascular    Pain in calf, thigh, or hip brought on by ambulation:    Pain in feet at night that wakes you up from your sleep:     Blood clot in your veins:    Leg  swelling:         Pulmonary    Oxygen at home:    Productive cough:     Wheezing:         Neurologic    Sudden weakness in arms or legs:     Sudden numbness in arms or legs:     Sudden onset of difficulty speaking or slurred speech:    Temporary loss of vision in one eye:     Problems with dizziness:         Gastrointestinal    Blood in stool:     Vomited blood:         Genitourinary    Burning when urinating:     Blood in urine:        Psychiatric    Major depression:         Hematologic    Bleeding problems:    Problems with blood clotting too easily:        Skin    Rashes or ulcers:        Constitutional    Fever or chills:    -  PHYSICAL EXAM:   Vitals:   06/17/21 1433  BP: 121/76  Pulse: (!) 59  Resp: 20  Temp: 98.3 F (36.8 C)  SpO2: 96%  Weight: 193 lb (87.5 kg)  Height: '5\' 10"'$  (1.778 m)   Body mass index is 27.69 kg/m. GENERAL: The patient is a well-nourished male, in no acute distress. The vital signs are documented above. CARDIAC: There is a regular rate and rhythm.  VASCULAR: I do not detect carotid bruits. He has palpable femoral, popliteal, dorsalis pedis, and posterior tibial pulses bilaterally. PULMONARY: There is good air exchange bilaterally without wheezing or rales. ABDOMEN: Soft and non-tender with normal pitched bowel sounds.  I do not palpate any aneurysm. MUSCULOSKELETAL: There are no major deformities. NEUROLOGIC: No focal weakness or paresthesias are detected. SKIN: There are no ulcers or rashes noted. PSYCHIATRIC: The patient has a normal affect.  DATA:    ARTERIAL DOPPLER STUDY: I have independently interpreted his arterial Doppler study today.  On the right side there is a triphasic dorsalis pedis and posterior tibial signal.  ABI is 100%.  Toe pressures 105 mmHg.  On the left side, there is a triphasic dorsalis pedis and posterior tibial signal.  ABI is 100%.  Toe pressures 114 mmHg.  Deitra [facility]

## 2021-08-09 ENCOUNTER — Encounter: Payer: Self-pay | Admitting: *Deleted

## 2021-08-10 ENCOUNTER — Other Ambulatory Visit: Payer: Self-pay

## 2021-08-10 ENCOUNTER — Ambulatory Visit: Payer: Medicare HMO | Admitting: Anesthesiology

## 2021-08-10 ENCOUNTER — Ambulatory Visit
Admission: RE | Admit: 2021-08-10 | Discharge: 2021-08-10 | Disposition: A | Discharge status: Home / Self Care | Payer: Medicare HMO | Attending: Gastroenterology | Admitting: Gastroenterology

## 2021-08-10 ENCOUNTER — Encounter: Payer: Self-pay | Admitting: *Deleted

## 2021-08-10 ENCOUNTER — Encounter
Admission: RE | Disposition: A | Discharge status: Home / Self Care | Payer: Self-pay | Source: Home / Self Care | Attending: Gastroenterology

## 2021-08-10 DIAGNOSIS — Z9889 Other specified postprocedural states: Secondary | ICD-10-CM | POA: Diagnosis not present

## 2021-08-10 DIAGNOSIS — K317 Polyp of stomach and duodenum: Secondary | ICD-10-CM | POA: Insufficient documentation

## 2021-08-10 DIAGNOSIS — R1013 Epigastric pain: Secondary | ICD-10-CM | POA: Insufficient documentation

## 2021-08-10 DIAGNOSIS — E785 Hyperlipidemia, unspecified: Secondary | ICD-10-CM | POA: Diagnosis not present

## 2021-08-10 DIAGNOSIS — K2960 Other gastritis without bleeding: Secondary | ICD-10-CM | POA: Diagnosis not present

## 2021-08-10 DIAGNOSIS — E119 Type 2 diabetes mellitus without complications: Secondary | ICD-10-CM | POA: Insufficient documentation

## 2021-08-10 DIAGNOSIS — K2950 Unspecified chronic gastritis without bleeding: Secondary | ICD-10-CM | POA: Diagnosis not present

## 2021-08-10 DIAGNOSIS — D131 Benign neoplasm of stomach: Secondary | ICD-10-CM | POA: Diagnosis not present

## 2021-08-10 HISTORY — PX: ESOPHAGOGASTRODUODENOSCOPY (EGD) WITH PROPOFOL: SHX5813

## 2021-08-10 SURGERY — ESOPHAGOGASTRODUODENOSCOPY (EGD) WITH PROPOFOL
Anesthesia: General

## 2021-08-10 MED ORDER — SODIUM CHLORIDE 0.9 % IV SOLN: INTRAVENOUS | Status: DC

## 2021-08-10 MED ORDER — PROPOFOL 10 MG/ML IV BOLUS
INTRAVENOUS | Status: DC | PRN
Start: 2021-08-10 — End: 2021-08-10
  Administered 2021-08-10: 50 mg via INTRAVENOUS
  Administered 2021-08-10: 80 mg via INTRAVENOUS
  Administered 2021-08-10: 50 mg via INTRAVENOUS
  Administered 2021-08-10 (×2): 20 mg via INTRAVENOUS

## 2021-08-10 MED ORDER — PROPOFOL 10 MG/ML IV BOLUS
INTRAVENOUS | Status: AC
  Filled 2021-08-10: qty 40

## 2021-08-10 NOTE — Anesthesia Preprocedure Evaluation (Addendum)
Anesthesia Evaluation  Patient identified by MRN, date of birth, ID band Patient awake    Reviewed: Allergy & Precautions, NPO status , Patient's Chart, lab work & pertinent test results  History of Anesthesia Complications Negative for: history of anesthetic complications  Airway Mallampati: II   Neck ROM: Full    Dental no notable dental hx.    Pulmonary asthma ,    Pulmonary exam normal breath sounds clear to auscultation       Cardiovascular Exercise Tolerance: Good Normal cardiovascular exam Rhythm:Regular Rate:Normal  ECG 03/13/21:  Sinus rhythm Low voltage, extremity and precordial leads   Neuro/Psych negative neurological ROS     GI/Hepatic negative GI ROS,   Endo/Other  diabetes, Type 2  Renal/GU negative Renal ROS   Prostate CA    Musculoskeletal   Abdominal   Peds  Hematology negative hematology ROS (+)   Anesthesia Other Findings   Reproductive/Obstetrics                             Anesthesia Physical  Anesthesia Plan  ASA: 2  Anesthesia Plan: General   Post-op Pain Management:    Induction: Intravenous  PONV Risk Score and Plan: 2 and Propofol infusion, TIVA and Treatment may vary due to age or medical condition  Airway Management Planned: Natural Airway  Additional Equipment:   Intra-op Plan:   Post-operative Plan:   Informed Consent: I have reviewed the patients History and Physical, chart, labs and discussed the procedure including the risks, benefits and alternatives for the proposed anesthesia with the patient or authorized representative who has indicated his/her understanding and acceptance.     Dental advisory given  Plan Discussed with: CRNA  Anesthesia Plan Comments:        Anesthesia Quick Evaluation

## 2021-08-10 NOTE — Transfer of Care (Signed)
Immediate Anesthesia Transfer of Care Note  Patient: Frank Wells  Procedure(s) Performed: ESOPHAGOGASTRODUODENOSCOPY (EGD) WITH PROPOFOL  Patient Location: PACU  Anesthesia Type:General  Level of Consciousness: awake and alert   Airway & Oxygen Therapy: Patient Spontanous Breathing and Patient connected to face mask oxygen  Post-op Assessment: Report given to RN and Post -op Vital signs reviewed and stable  Post vital signs: Reviewed and stable  Last Vitals:  Vitals Value Taken Time  BP 87/63 08/10/21 1244  Temp 35.9 C 08/10/21 1244  Pulse 67 08/10/21 1247  Resp 16 08/10/21 1247  SpO2 97 % 08/10/21 1247  Vitals shown include unvalidated device data.  Last Pain:  Vitals:   08/10/21 1244  TempSrc: Temporal  PainSc: Asleep         Complications: No notable events documented.

## 2021-08-10 NOTE — Anesthesia Postprocedure Evaluation (Signed)
Anesthesia Post Note  Patient: Frank Wells  Procedure(s) Performed: ESOPHAGOGASTRODUODENOSCOPY (EGD) WITH PROPOFOL  Patient location during evaluation: PACU Anesthesia Type: General Level of consciousness: awake and alert Pain management: pain level controlled Vital Signs Assessment: post-procedure vital signs reviewed and stable Respiratory status: spontaneous breathing, nonlabored ventilation and respiratory function stable Cardiovascular status: blood pressure returned to baseline and stable Postop Assessment: no apparent nausea or vomiting Anesthetic complications: no   No notable events documented.   Last Vitals:  Vitals:   08/10/21 1254 08/10/21 1304  BP: 100/64 111/64  Pulse: 65 60  Resp: 15 16  Temp:    SpO2: 98% 100%    Last Pain:  Vitals:   08/10/21 1304  TempSrc:   PainSc: 0-No pain                 Iran Ouch

## 2021-08-10 NOTE — Interval H&P Note (Signed)
History and Physical Interval Note:  08/10/2021 12:29 PM  Frank Wells  has presented today for surgery, with the diagnosis of Dyspepsia.  The various methods of treatment have been discussed with the patient and family. After consideration of risks, benefits and other options for treatment, the patient has consented to  Procedure(s): ESOPHAGOGASTRODUODENOSCOPY (EGD) WITH PROPOFOL (N/A) as a surgical intervention.  The patient's history has been reviewed, patient examined, no change in status, stable for surgery.  I have reviewed the patient's chart and labs.  Questions were answered to the patient's satisfaction.     Lesly Rubenstein  Ok to proceed with EGD

## 2021-08-10 NOTE — Op Note (Signed)
[facility] Patient Name: Frank Wells Procedure Date: 08/10/2021 11:14 AM MRN: [id number] Account #: [id number] Date of Birth: 1950-10-26 Admit Type: Outpatient Age: 71 Room: [facility] ENDO ROOM 1 Gender: Male Note Status: Finalized Instrument Name: Upper Endoscope [id number] Procedure:             Upper GI endoscopy Indications:           Dyspepsia Providers:             Andrey Farmer MD, MD Medicines:             Monitored Anesthesia Care Complications:         No immediate complications. Estimated blood loss:                         Minimal. Procedure:             Pre-Anesthesia Assessment:                        - Prior to the procedure, a History and Physical was                         performed, and patient medications and allergies were                         reviewed. The patient is competent. The risks and                         benefits of the procedure and the sedation options and                         risks were discussed with the patient. All questions                         were answered and informed consent was obtained.                         Patient identification and proposed procedure were                         verified by the physician, the nurse, the                         anesthesiologist, the anesthetist and the technician                         in the endoscopy suite. Mental Status Examination:                         alert and oriented. Airway Examination: normal                         oropharyngeal airway and neck mobility. Respiratory                         Examination: clear to auscultation. CV Examination:                         normal. Prophylactic Antibiotics: The patient does not  require prophylactic antibiotics. Prior                         Anticoagulants: The patient has taken no previous                         anticoagulant or antiplatelet agents. ASA Grade                          Assessment: II - A patient with mild systemic disease.                         After reviewing the risks and benefits, the patient                         was deemed in satisfactory condition to undergo the                         procedure. The anesthesia plan was to use monitored                         anesthesia care (MAC). Immediately prior to                         administration of medications, the patient was                         re-assessed for adequacy to receive sedatives. The                         heart rate, respiratory rate, oxygen saturations,                         blood pressure, adequacy of pulmonary ventilation, and                         response to care were monitored throughout the                         procedure. The physical status of the patient was                         re-assessed after the procedure.                        After obtaining informed consent, the endoscope was                         passed under direct vision. Throughout the procedure,                         the patient's blood pressure, pulse, and oxygen                         saturations were monitored continuously. The Endoscope                         was introduced through the mouth, and advanced to the  second part of duodenum. The upper GI endoscopy was                         accomplished without difficulty. The patient tolerated                         the procedure well. Findings:      The examined esophagus was normal.      A few small sessile fundic gland polyps with no bleeding and no stigmata       of recent bleeding were found in the gastric fundus and in the gastric       body. One polyp was removed with a cold biopsy forceps. Resection and       retrieval were complete. Estimated blood loss was minimal.      Biopsies were taken with a cold forceps in the stomach for Helicobacter       pylori testing. Estimated blood loss was minimal.       The exam of the stomach was otherwise normal.      The examined duodenum was normal. Impression:            - Normal esophagus.                        - A few fundic gland polyps. One was sampled.                        - Normal examined duodenum.                        - Biopsies were taken with a cold forceps for                         Helicobacter pylori testing. Recommendation:        - Discharge patient to home.                        - Resume previous diet.                        - Continue present medications.                        - Await pathology results.                        - Return to referring physician as previously                         scheduled. Procedure Code(s):     --- Professional ---                        [phone], Esophagogastroduodenoscopy, flexible,                         transoral; with biopsy, single or multiple Diagnosis Code(s):     --- Professional ---                        K31.7, Polyp of stomach and duodenum  R10.13, Epigastric pain CPT copyright 2019 American Medical Association. All rights reserved. The codes documented in this report are preliminary and upon coder review may  be revised to meet current compliance requirements. Andrey Farmer MD, MD 08/10/2021 12:46:08 PM Number of Addenda: 0 Note Initiated On: 08/10/2021 11:14 AM Estimated Blood Loss:  Estimated blood loss was minimal.      [facility]

## 2021-08-10 NOTE — H&P (Signed)
Outpatient short stay form Pre-procedure 08/10/2021  Lesly Rubenstein, MD  Primary Physician: Carol Ada, MD  Reason for visit:  Dyspepsia  History of present illness:    71 y/o gentleman with history of HLD here for EGD for dyspepsia. No blood thinners. History of hernia repair and prostectomy.    Current Facility-Administered Medications:    0.9 %  sodium chloride infusion, , Intravenous, Continuous, Brystol Wasilewski, Hilton Cork, MD, Last Rate: 20 mL/hr at 08/10/21 1152, New Bag at 08/10/21 1152  Medications Prior to Admission  Medication Sig Dispense Refill Last Dose   albuterol (PROVENTIL HFA;VENTOLIN HFA) 108 (90 Base) MCG/ACT inhaler Inhale 2 puffs into the lungs every 6 (six) hours as needed for wheezing or shortness of breath.   Past Week   pantoprazole (PROTONIX) 40 MG tablet Take 40 mg by mouth daily.   Past Week   rosuvastatin (CRESTOR) 10 MG tablet Take 10 mg by mouth daily.        Allergies  Allergen Reactions   Codeine Shortness Of Breath     Past Medical History:  Diagnosis Date   Agatston coronary artery calcium score less than 100    70.8 Ca score CT 08/2020   Allergic genetic state    Asthma    Cancer ([place])    Prostate   Chronic cough    Diabetes mellitus without complication ([place])    diet controlled   Hyperlipidemia    Inguinal hernia    Umbilical hernia     Review of systems:  Otherwise negative.    Physical Exam  Gen: Alert, oriented. Appears stated age.  HEENT: PERRLA. Lungs: No respiratory distress CV: RRR Abd: soft, benign, no masses Ext: No edema    Planned procedures: Proceed with EGD. The patient understands the nature of the planned procedure, indications, risks, alternatives and potential complications including but not limited to bleeding, infection, perforation, damage to internal organs and possible oversedation/side effects from anesthesia. The patient agrees and gives consent to proceed.  Please refer to procedure notes for  findings, recommendations and patient disposition/instructions.     Lesly Rubenstein, MD [facility]

## 2021-08-11 ENCOUNTER — Encounter: Payer: Self-pay | Admitting: Gastroenterology

## 2021-08-12 LAB — SURGICAL PATHOLOGY

## 2021-10-12 DIAGNOSIS — M25552 Pain in left hip: Secondary | ICD-10-CM | POA: Diagnosis not present

## 2021-10-12 DIAGNOSIS — E1169 Type 2 diabetes mellitus with other specified complication: Secondary | ICD-10-CM | POA: Diagnosis not present

## 2021-10-12 DIAGNOSIS — Z0000 Encounter for general adult medical examination without abnormal findings: Secondary | ICD-10-CM | POA: Diagnosis not present

## 2021-10-12 DIAGNOSIS — Z1331 Encounter for screening for depression: Secondary | ICD-10-CM | POA: Diagnosis not present

## 2021-10-12 DIAGNOSIS — I700 Atherosclerosis of aorta: Secondary | ICD-10-CM | POA: Diagnosis not present

## 2021-10-12 DIAGNOSIS — E7800 Pure hypercholesterolemia, unspecified: Secondary | ICD-10-CM | POA: Diagnosis not present

## 2021-10-12 DIAGNOSIS — Z8546 Personal history of malignant neoplasm of prostate: Secondary | ICD-10-CM | POA: Diagnosis not present

## 2021-10-12 DIAGNOSIS — Z23 Encounter for immunization: Secondary | ICD-10-CM | POA: Diagnosis not present

## 2021-10-18 DIAGNOSIS — R051 Acute cough: Secondary | ICD-10-CM | POA: Diagnosis not present

## 2021-10-29 IMAGING — CR DG CHEST 2V
2 series · 2 of 2 positions shown · non-contrast
Comparison: 06/23/2008 from [HOSPITAL]

CLINICAL DATA: Nonproductive cough. Chest discomfort. [id number]
virus infection.

EXAM:
CHEST - 2 VIEW

[w chest pa]
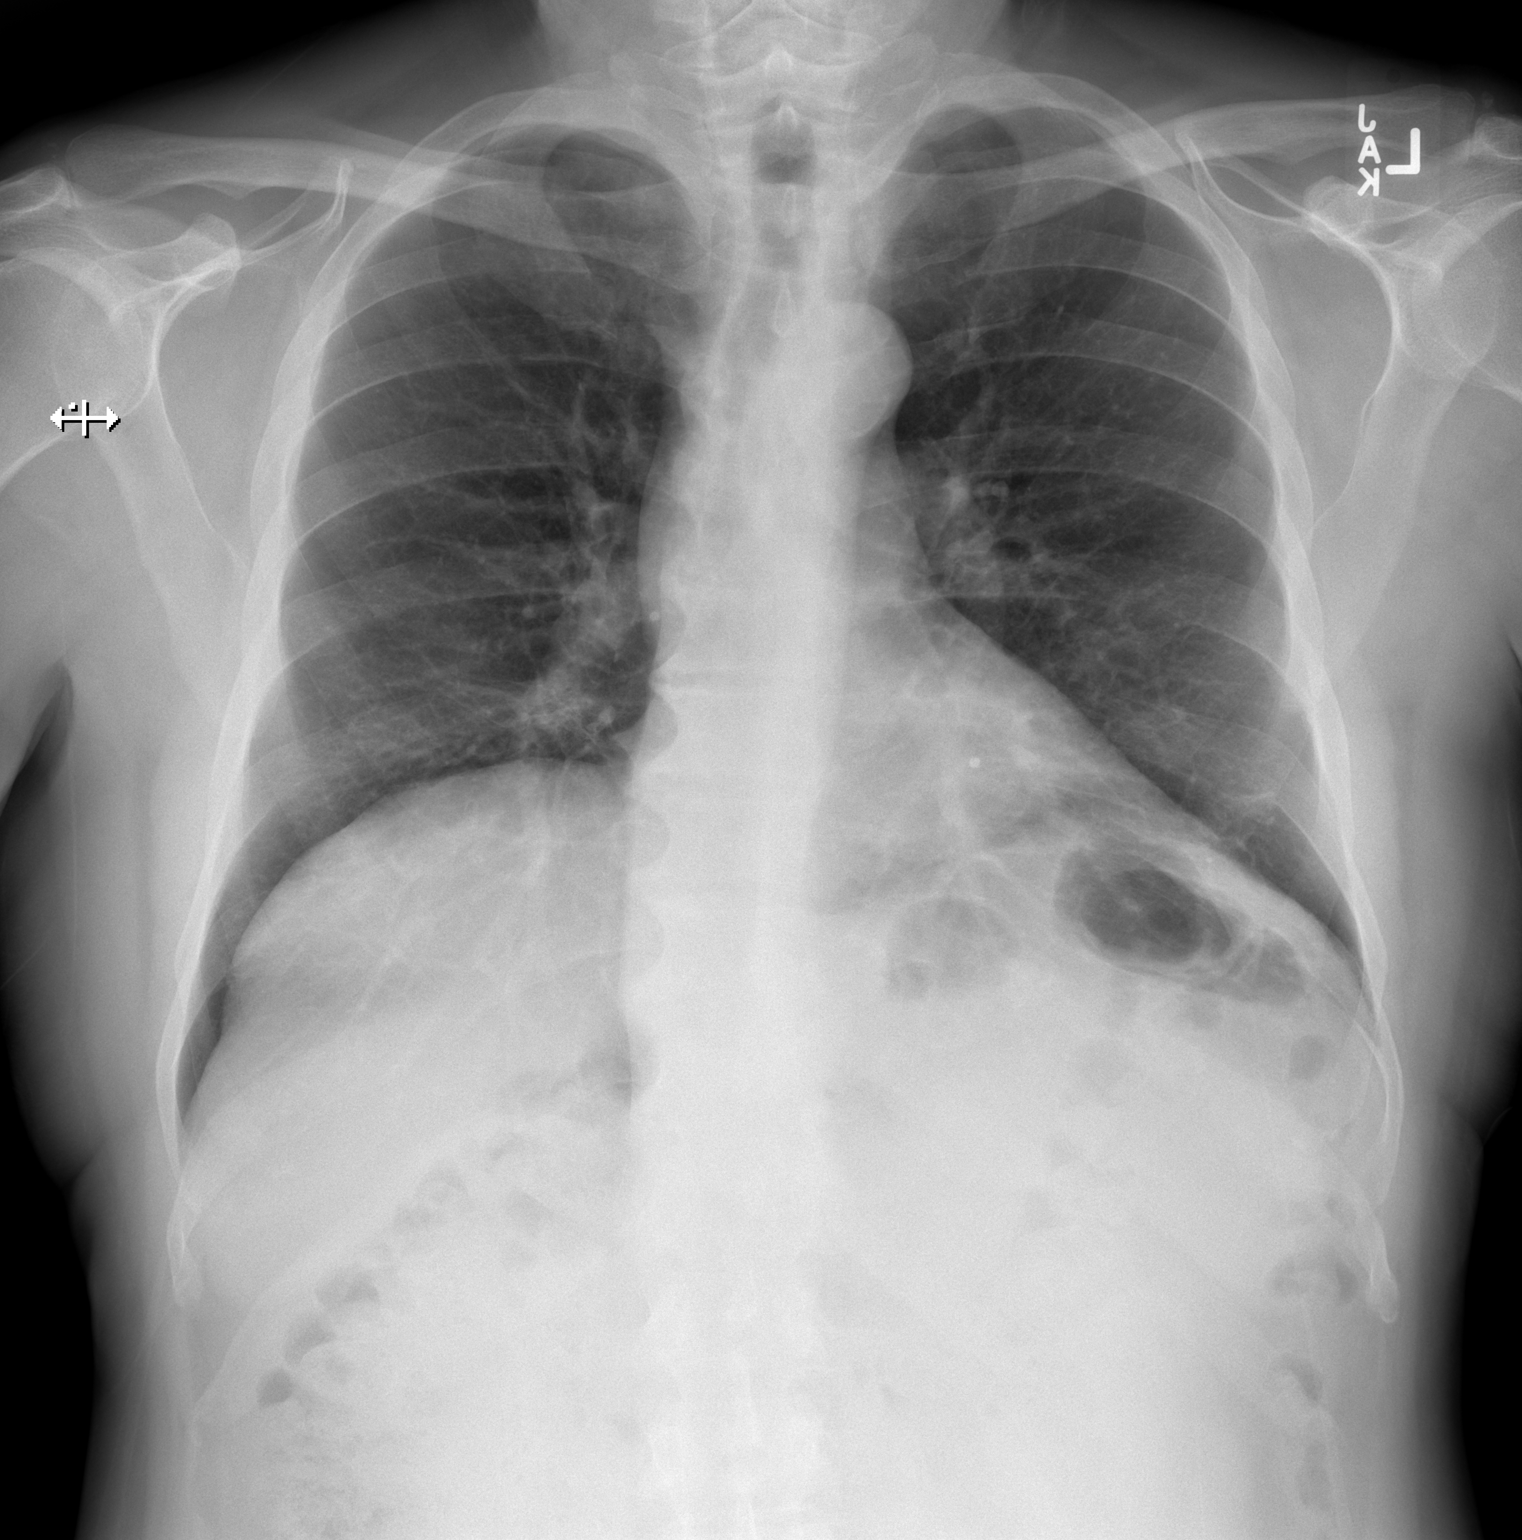

[w chest lat]
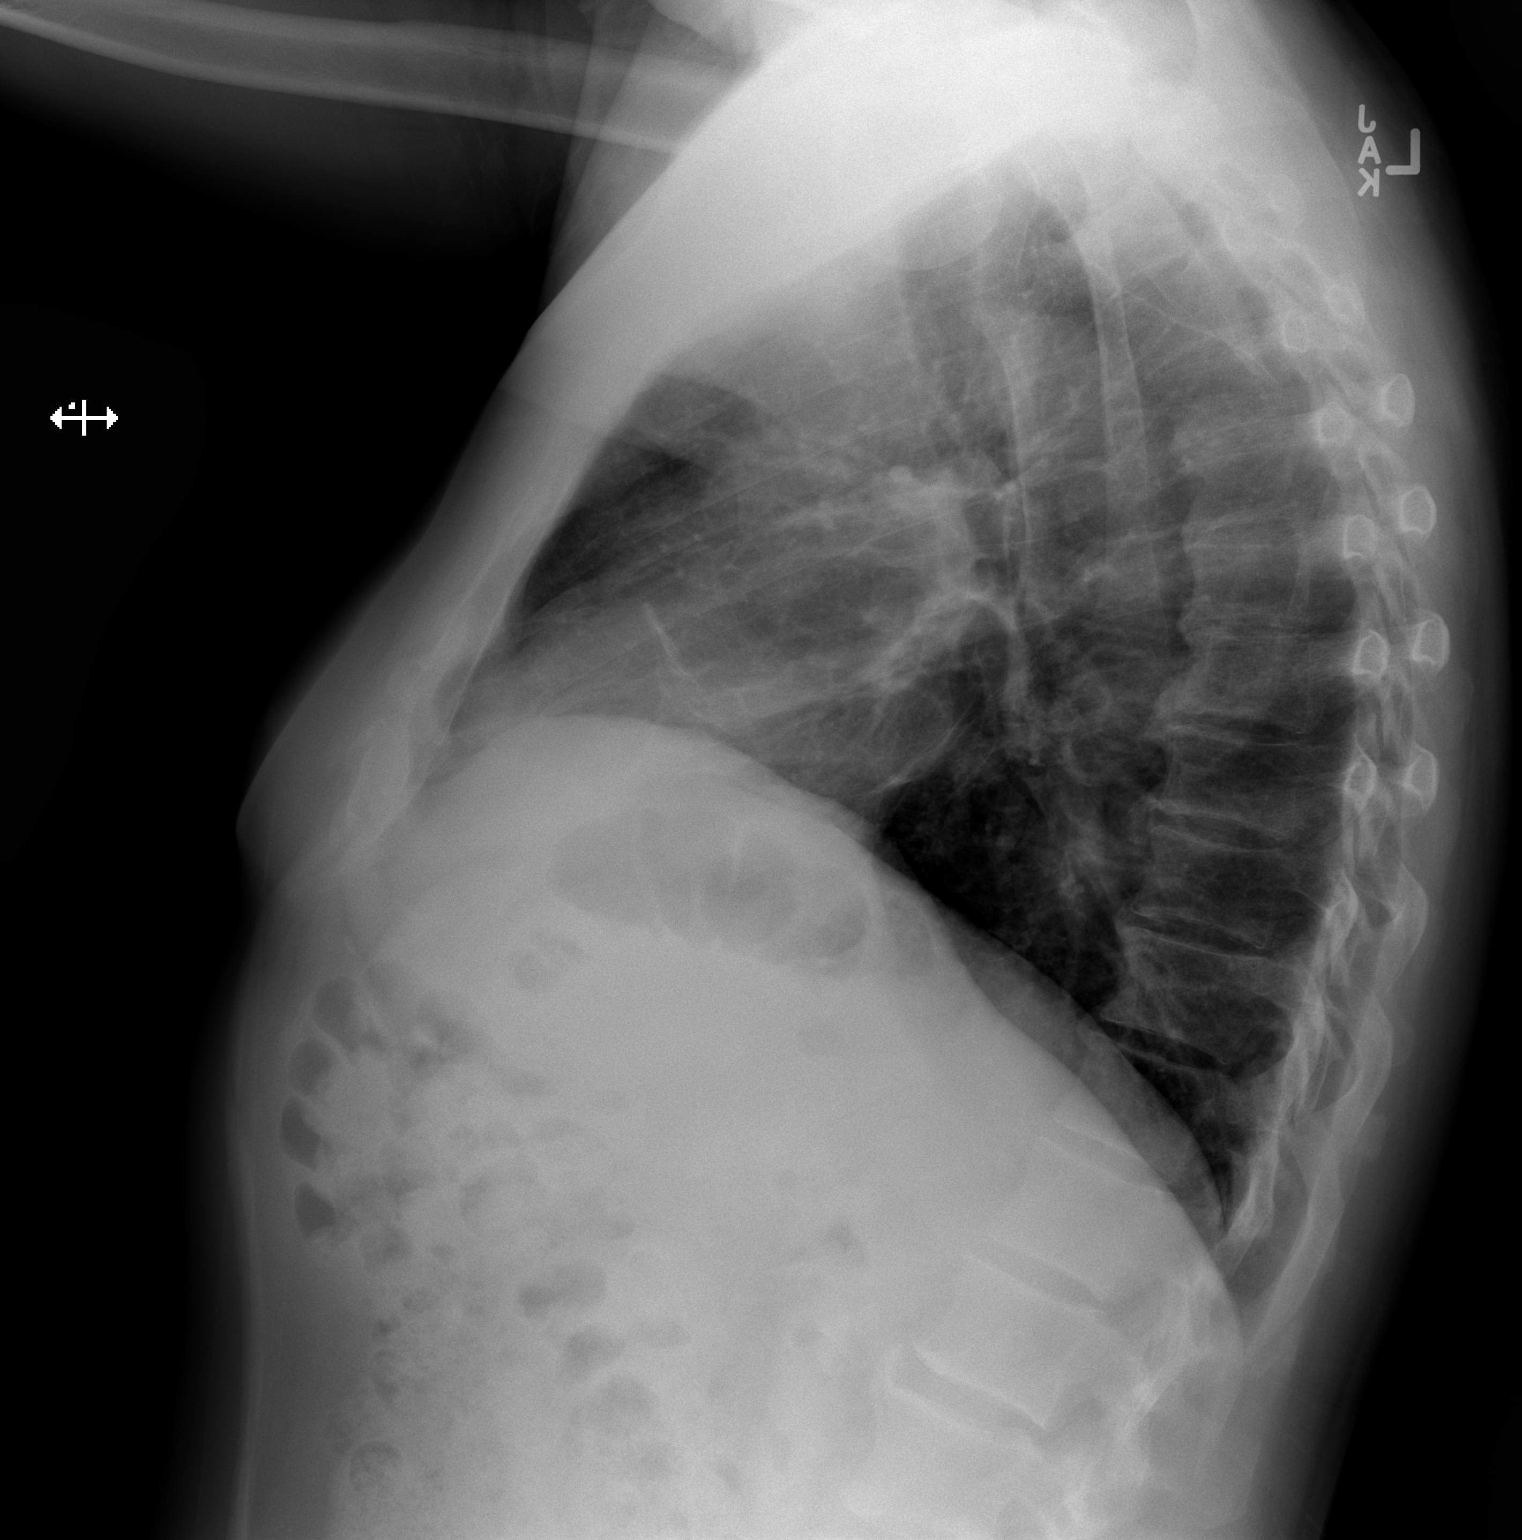

[2 of 2 positions shown; findings below may reference images not displayed]

FINDINGS: The heart size and mediastinal contours are within normal limits.
Aortic atherosclerosis incidentally noted. Mild scarring in lingula
is stable. Lungs are otherwise clear. The visualized skeletal
structures are unremarkable.
IMPRESSION: Stable exam.  No active cardiopulmonary disease.

## 2021-11-02 DIAGNOSIS — L570 Actinic keratosis: Secondary | ICD-10-CM | POA: Diagnosis not present

## 2021-11-02 DIAGNOSIS — L821 Other seborrheic keratosis: Secondary | ICD-10-CM | POA: Diagnosis not present

## 2021-11-02 DIAGNOSIS — L820 Inflamed seborrheic keratosis: Secondary | ICD-10-CM | POA: Diagnosis not present

## 2021-11-02 DIAGNOSIS — L814 Other melanin hyperpigmentation: Secondary | ICD-10-CM | POA: Diagnosis not present

## 2021-11-02 DIAGNOSIS — D229 Melanocytic nevi, unspecified: Secondary | ICD-10-CM | POA: Diagnosis not present

## 2021-11-09 DIAGNOSIS — M25571 Pain in right ankle and joints of right foot: Secondary | ICD-10-CM | POA: Diagnosis not present

## 2021-11-09 DIAGNOSIS — M25552 Pain in left hip: Secondary | ICD-10-CM | POA: Diagnosis not present

## 2021-11-30 DIAGNOSIS — Z125 Encounter for screening for malignant neoplasm of prostate: Secondary | ICD-10-CM | POA: Diagnosis not present

## 2021-12-07 DIAGNOSIS — M25512 Pain in left shoulder: Secondary | ICD-10-CM | POA: Diagnosis not present

## 2021-12-07 DIAGNOSIS — M25552 Pain in left hip: Secondary | ICD-10-CM | POA: Diagnosis not present

## 2021-12-08 DIAGNOSIS — K4090 Unilateral inguinal hernia, without obstruction or gangrene, not specified as recurrent: Secondary | ICD-10-CM | POA: Diagnosis not present

## 2021-12-08 DIAGNOSIS — Z125 Encounter for screening for malignant neoplasm of prostate: Secondary | ICD-10-CM | POA: Diagnosis not present

## 2022-01-11 DIAGNOSIS — L089 Local infection of the skin and subcutaneous tissue, unspecified: Secondary | ICD-10-CM | POA: Diagnosis not present

## 2022-01-13 DIAGNOSIS — L089 Local infection of the skin and subcutaneous tissue, unspecified: Secondary | ICD-10-CM | POA: Diagnosis not present

## 2022-01-14 DIAGNOSIS — M79672 Pain in left foot: Secondary | ICD-10-CM | POA: Diagnosis not present

## 2022-01-14 DIAGNOSIS — M79675 Pain in left toe(s): Secondary | ICD-10-CM | POA: Diagnosis not present

## 2022-02-01 DIAGNOSIS — M79672 Pain in left foot: Secondary | ICD-10-CM | POA: Diagnosis not present

## 2022-02-02 DIAGNOSIS — R69 Illness, unspecified: Secondary | ICD-10-CM | POA: Diagnosis not present

## 2022-02-09 DIAGNOSIS — E13621 Other specified diabetes mellitus with foot ulcer: Secondary | ICD-10-CM | POA: Diagnosis not present

## 2022-02-09 DIAGNOSIS — M79672 Pain in left foot: Secondary | ICD-10-CM | POA: Diagnosis not present

## 2022-03-08 DIAGNOSIS — R69 Illness, unspecified: Secondary | ICD-10-CM | POA: Diagnosis not present

## 2022-03-24 DIAGNOSIS — E119 Type 2 diabetes mellitus without complications: Secondary | ICD-10-CM | POA: Diagnosis not present

## 2022-03-24 DIAGNOSIS — K4090 Unilateral inguinal hernia, without obstruction or gangrene, not specified as recurrent: Secondary | ICD-10-CM | POA: Diagnosis not present

## 2022-03-24 DIAGNOSIS — Z8546 Personal history of malignant neoplasm of prostate: Secondary | ICD-10-CM | POA: Diagnosis not present

## 2022-03-24 DIAGNOSIS — K4091 Unilateral inguinal hernia, without obstruction or gangrene, recurrent: Secondary | ICD-10-CM | POA: Diagnosis not present

## 2022-03-24 DIAGNOSIS — N433 Hydrocele, unspecified: Secondary | ICD-10-CM | POA: Diagnosis not present

## 2022-03-24 DIAGNOSIS — E785 Hyperlipidemia, unspecified: Secondary | ICD-10-CM | POA: Diagnosis not present

## 2022-04-13 DIAGNOSIS — E7800 Pure hypercholesterolemia, unspecified: Secondary | ICD-10-CM | POA: Diagnosis not present

## 2022-04-13 DIAGNOSIS — Z8546 Personal history of malignant neoplasm of prostate: Secondary | ICD-10-CM | POA: Diagnosis not present

## 2022-04-13 DIAGNOSIS — L089 Local infection of the skin and subcutaneous tissue, unspecified: Secondary | ICD-10-CM | POA: Diagnosis not present

## 2022-04-13 DIAGNOSIS — E119 Type 2 diabetes mellitus without complications: Secondary | ICD-10-CM | POA: Diagnosis not present

## 2022-05-17 DIAGNOSIS — L570 Actinic keratosis: Secondary | ICD-10-CM | POA: Diagnosis not present

## 2022-05-17 DIAGNOSIS — L820 Inflamed seborrheic keratosis: Secondary | ICD-10-CM | POA: Diagnosis not present

## 2022-05-17 DIAGNOSIS — L821 Other seborrheic keratosis: Secondary | ICD-10-CM | POA: Diagnosis not present

## 2022-05-17 DIAGNOSIS — L578 Other skin changes due to chronic exposure to nonionizing radiation: Secondary | ICD-10-CM | POA: Diagnosis not present

## 2022-05-17 DIAGNOSIS — L814 Other melanin hyperpigmentation: Secondary | ICD-10-CM | POA: Diagnosis not present

## 2022-05-17 DIAGNOSIS — Z85828 Personal history of other malignant neoplasm of skin: Secondary | ICD-10-CM | POA: Diagnosis not present

## 2022-05-17 DIAGNOSIS — D229 Melanocytic nevi, unspecified: Secondary | ICD-10-CM | POA: Diagnosis not present

## 2022-05-18 DIAGNOSIS — Z0100 Encounter for examination of eyes and vision without abnormal findings: Secondary | ICD-10-CM | POA: Diagnosis not present

## 2022-05-18 DIAGNOSIS — H524 Presbyopia: Secondary | ICD-10-CM | POA: Diagnosis not present

## 2022-06-14 DIAGNOSIS — E1140 Type 2 diabetes mellitus with diabetic neuropathy, unspecified: Secondary | ICD-10-CM | POA: Diagnosis not present

## 2022-06-28 DIAGNOSIS — L02212 Cutaneous abscess of back [any part, except buttock]: Secondary | ICD-10-CM | POA: Diagnosis not present

## 2022-06-28 DIAGNOSIS — Z0000 Encounter for general adult medical examination without abnormal findings: Secondary | ICD-10-CM | POA: Diagnosis not present

## 2022-09-15 DIAGNOSIS — Z85828 Personal history of other malignant neoplasm of skin: Secondary | ICD-10-CM | POA: Diagnosis not present

## 2022-09-15 DIAGNOSIS — L820 Inflamed seborrheic keratosis: Secondary | ICD-10-CM | POA: Diagnosis not present

## 2022-09-15 DIAGNOSIS — L720 Epidermal cyst: Secondary | ICD-10-CM | POA: Diagnosis not present

## 2022-11-03 DIAGNOSIS — R0683 Snoring: Secondary | ICD-10-CM | POA: Diagnosis not present

## 2022-11-15 DIAGNOSIS — Z85828 Personal history of other malignant neoplasm of skin: Secondary | ICD-10-CM | POA: Diagnosis not present

## 2022-11-15 DIAGNOSIS — B079 Viral wart, unspecified: Secondary | ICD-10-CM | POA: Diagnosis not present

## 2022-11-15 DIAGNOSIS — L820 Inflamed seborrheic keratosis: Secondary | ICD-10-CM | POA: Diagnosis not present

## 2022-11-15 DIAGNOSIS — L814 Other melanin hyperpigmentation: Secondary | ICD-10-CM | POA: Diagnosis not present

## 2022-11-15 DIAGNOSIS — L738 Other specified follicular disorders: Secondary | ICD-10-CM | POA: Diagnosis not present

## 2022-11-15 DIAGNOSIS — Z129 Encounter for screening for malignant neoplasm, site unspecified: Secondary | ICD-10-CM | POA: Diagnosis not present

## 2022-11-15 DIAGNOSIS — L918 Other hypertrophic disorders of the skin: Secondary | ICD-10-CM | POA: Diagnosis not present

## 2022-11-15 DIAGNOSIS — L84 Corns and callosities: Secondary | ICD-10-CM | POA: Diagnosis not present

## 2022-11-23 DIAGNOSIS — G4733 Obstructive sleep apnea (adult) (pediatric): Secondary | ICD-10-CM | POA: Diagnosis not present

## 2022-11-23 DIAGNOSIS — R0683 Snoring: Secondary | ICD-10-CM | POA: Diagnosis not present

## 2022-12-19 DIAGNOSIS — Z125 Encounter for screening for malignant neoplasm of prostate: Secondary | ICD-10-CM | POA: Diagnosis not present

## 2022-12-21 DIAGNOSIS — C61 Malignant neoplasm of prostate: Secondary | ICD-10-CM | POA: Diagnosis not present

## 2022-12-21 DIAGNOSIS — Z125 Encounter for screening for malignant neoplasm of prostate: Secondary | ICD-10-CM | POA: Diagnosis not present

## 2023-05-02 DIAGNOSIS — E119 Type 2 diabetes mellitus without complications: Secondary | ICD-10-CM | POA: Diagnosis not present

## 2023-05-02 DIAGNOSIS — E7800 Pure hypercholesterolemia, unspecified: Secondary | ICD-10-CM | POA: Diagnosis not present

## 2023-05-02 DIAGNOSIS — Z8546 Personal history of malignant neoplasm of prostate: Secondary | ICD-10-CM | POA: Diagnosis not present

## 2023-05-02 DIAGNOSIS — Z23 Encounter for immunization: Secondary | ICD-10-CM | POA: Diagnosis not present

## 2023-05-11 IMAGING — CT CT CARDIAC CORONARY ARTERY CALCIUM SCORE
3 series · 14 of 20 positions shown, 16 images · non-contrast
Comparison: None.
COMPARISON: None.

Addendum:
EXAM:
OVER-READ INTERPRETATION  CT CHEST

The following report is an over-read performed by radiologist Dr.
Lisett [facility] Ar[facility] [REDACTED] on 09/01/2020. This
over-read does not include interpretation of cardiac or coronary
anatomy or pathology. The coronary calcium score interpretation by
the cardiologist is attached.
CLINICAL DATA: Cardiovascular Disease Risk stratification
Coronary Calcium Score
TECHNIQUE: A gated, non-contrast computed tomography scan of the heart was
performed using 3mm slice thickness. Axial images were analyzed on a
dedicated workstation. Calcium scoring of the coronary arteries was
performed using the Agatston method.

[Series 2: cascseq 2.0 sa36 70% (id) · axial · 0.49mm/px · z∈[-241,-151]mm · 4 of 76 slices shown]
[im 16/76  vessel]
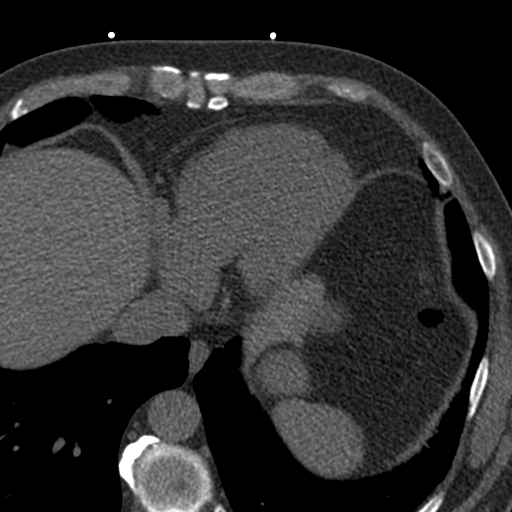
[im 31/76  vessel]
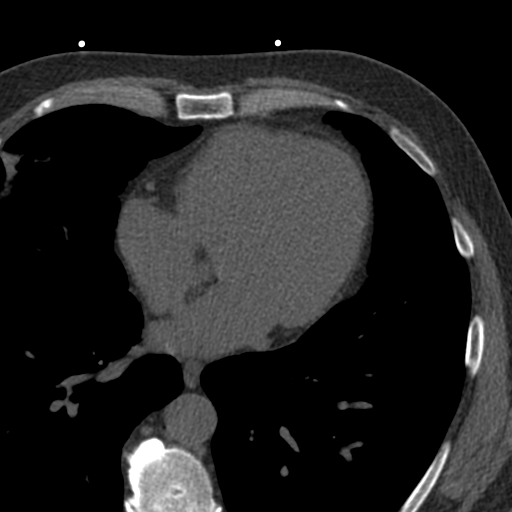
[im 46/76  vessel]
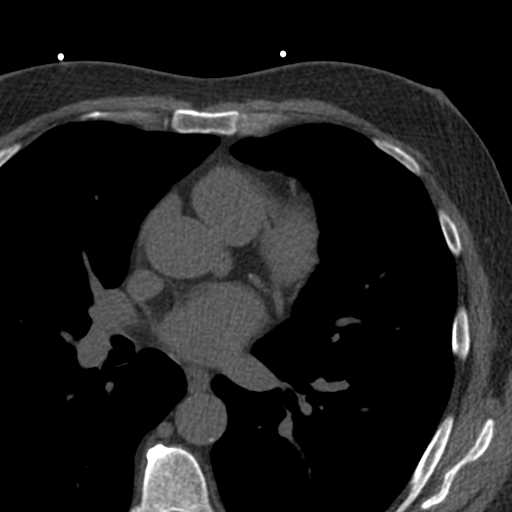
[im 61/76  vessel]
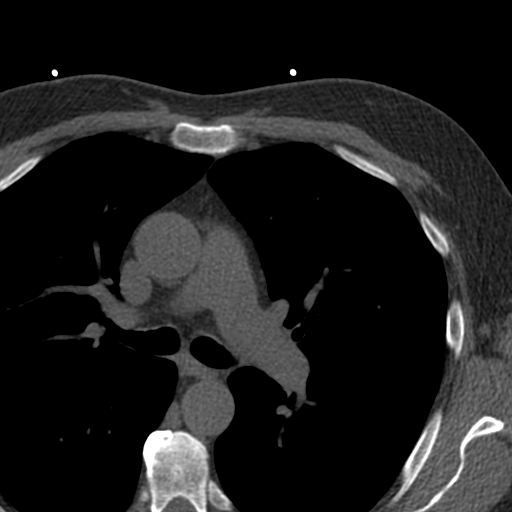

[Series 3: cascseq 2.0 bf37 st · axial · 0.73mm/px · z∈[-247,-147]mm · 5 of 76 slices shown, 7 images]
[im 13/76  vessel]
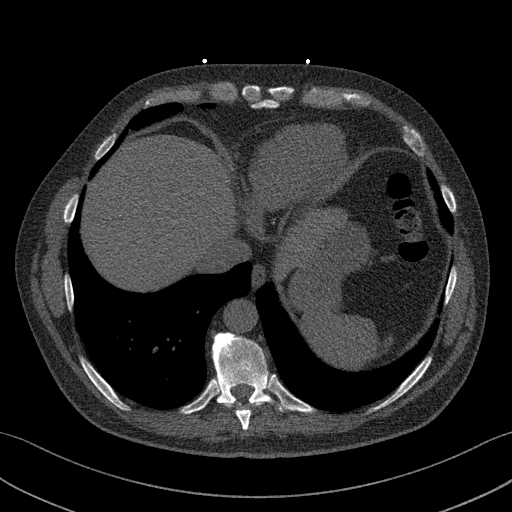
[im 13/76  lung]
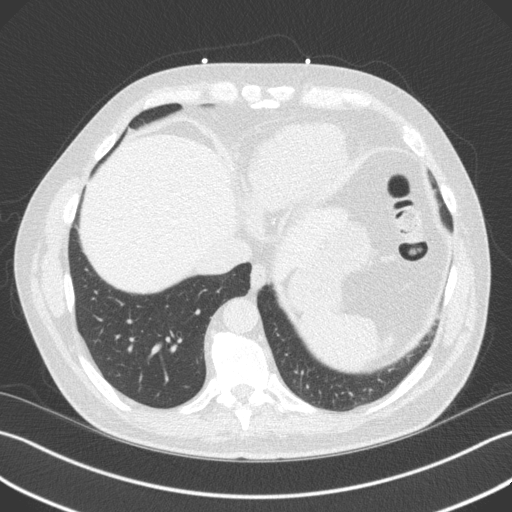
[im 26/76  vessel]
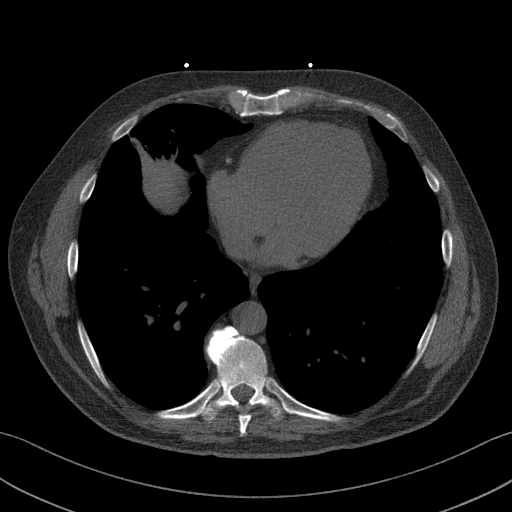
[im 38/76  vessel]
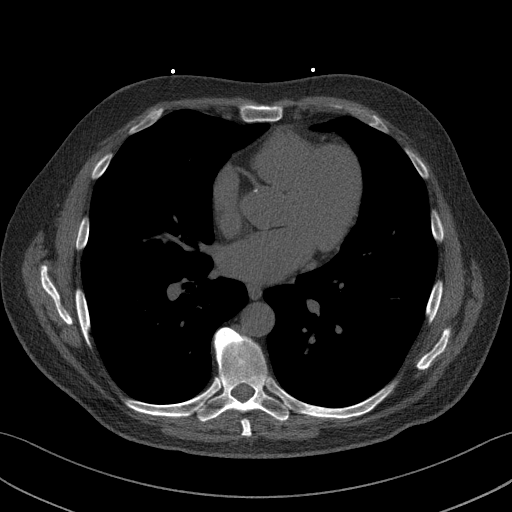
[im 51/76  vessel]
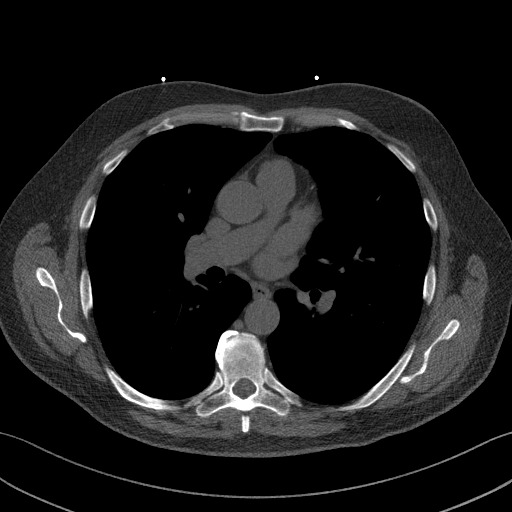
[im 63/76  vessel]
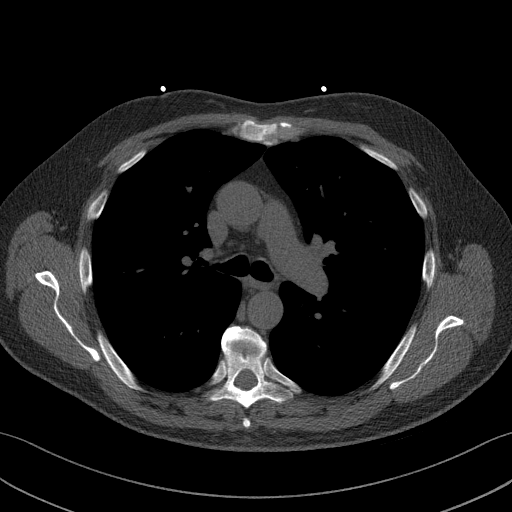
[im 63/76  lung]
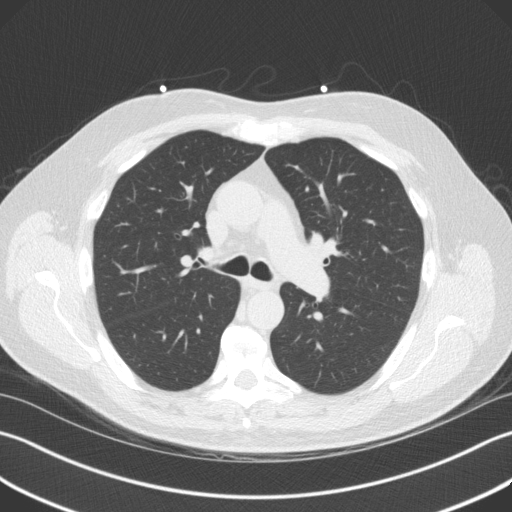

[Series 4: cascseq 2.0 br59 lung · axial · 0.73mm/px · z∈[-247,-147]mm · 5 of 76 slices shown]
[im 13/76  lung]
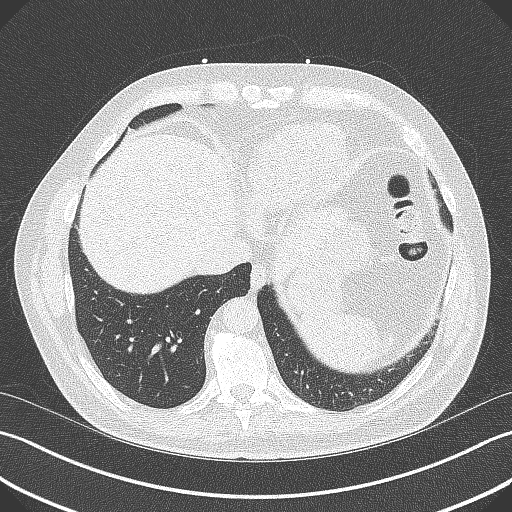
[im 26/76  lung]
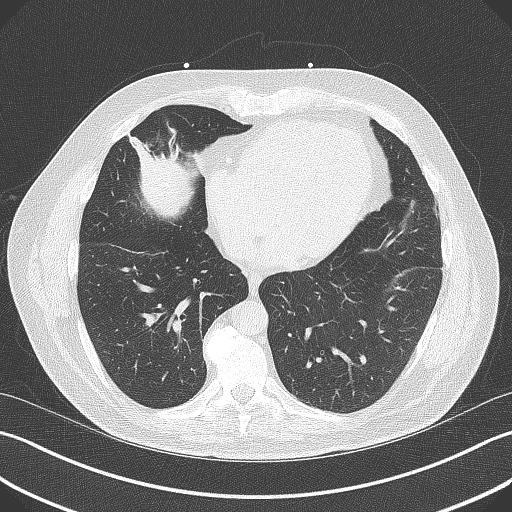
[im 38/76  lung]
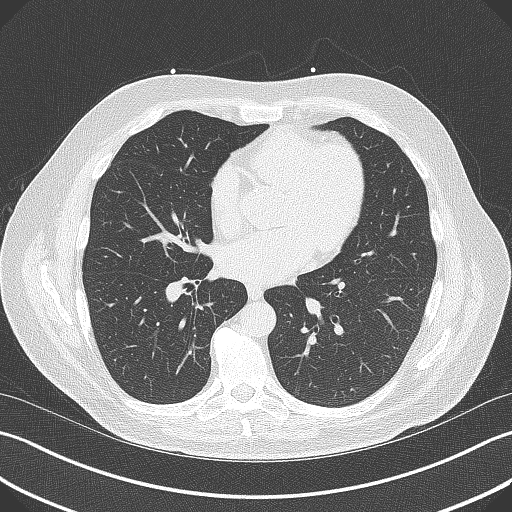
[im 51/76  lung]
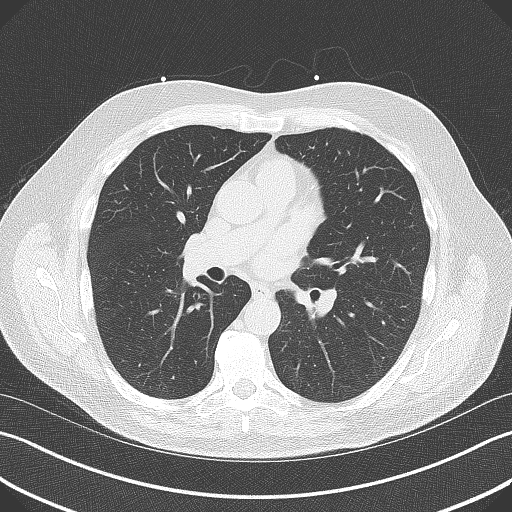
[im 63/76  lung]
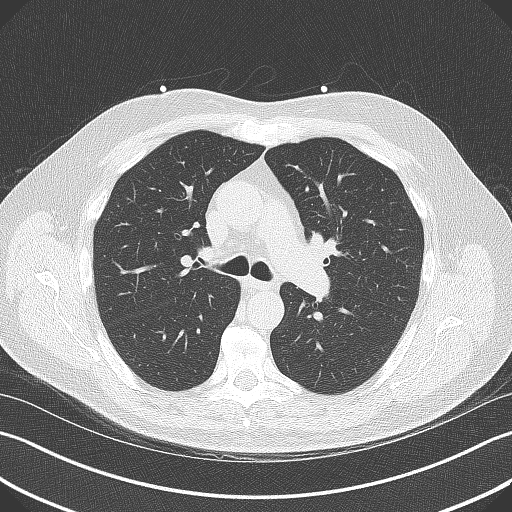

[14 of 20 positions shown; findings below may reference images not displayed]

FINDINGS: Atherosclerotic calcifications in the thoracic aorta. Scarring and
volume loss in the right middle lobe and to a lesser extent
elsewhere in the lung bases, presumably sequela of prior infections.
Multiple tiny 2-3 mm pulmonary nodules scattered throughout the
visualize lungs bilaterally, nonspecific. Within the visualized
portions of the thorax there are no suspicious appearing pulmonary
nodules or masses, there is no acute consolidative airspace disease,
no pleural effusions, no pneumothorax and no lymphadenopathy.
Visualized portions of the upper abdomen are unremarkable. There are
no aggressive appearing lytic or blastic lesions noted in the
visualized portions of the skeleton.
IMPRESSION: 1. Multiple small pulmonary nodules measuring 2-3 mm in size,
nonspecific, but statistically likely benign. No follow-up needed if
patient is low-risk (and has no known or suspected primary
neoplasm). Non-contrast chest CT can be considered in 12 months if
patient is high-risk. This recommendation follows the consensus
statement: Guidelines for Management of Incidental Pulmonary Nodules
Detected on CT Images: From the [HOSPITAL] 3777; Radiology
2.  Aortic Atherosclerosis ([id number]).
FINDINGS: Coronary arteries: Normal origins.

Coronary Calcium Score:

Left main:

Left anterior descending artery:

Left circumflex artery: 0

Right coronary artery:

Total:

Percentile: 42nd

Pericardium: Normal.

Ascending Aorta: Normal caliber.

Mitral annular calcifications are present.

Non-cardiac: See separate report from [REDACTED].
IMPRESSION: Coronary calcium score of 70.8. This was 42nd percentile for age-,
race-, and sex-matched controls.



If CAC=0, it is reasonable to withhold statin therapy and reassess
in 5 to 10 years, as long as higher risk conditions are absent
(diabetes mellitus, family history of premature CHD in first degree
relatives (males <55 years; females <65 years), cigarette smoking,
or LDL >=190 mg/dL).

If CAC is 1 to 99, it is reasonable to initiate statin therapy for
patients >=55 years of age.

If CAC is >=100 or >=75th percentile, it is reasonable to initiate
statin therapy at any age.

Cardiology referral should be considered for patients with CAC
scores >=400 or >=75th percentile.

*2308 AHA/ACC/AACVPR/AAPA/ABC/INSTALACIJE/MIL[place]IN M[place]/DUBON/Jfelix/PREISNER/NIVIRUS/CREIG
Guideline on the Management of Blood Cholesterol: A Report of the
American College of Cardiology/American Heart Association Task Force
on Clinical Practice Guidelines. J Am Coll Cardiol.
1847;73(24):2909-2694.

*** End of Addendum ***
EXAM:
OVER-READ INTERPRETATION  CT CHEST

The following report is an over-read performed by radiologist Dr.
Lisett [facility] [REDACTED] on 09/01/2020. This
over-read does not include interpretation of cardiac or coronary
anatomy or pathology. The coronary calcium score interpretation by
the cardiologist is attached.
FINDINGS: Atherosclerotic calcifications in the thoracic aorta. Scarring and
volume loss in the right middle lobe and to a lesser extent
elsewhere in the lung bases, presumably sequela of prior infections.
Multiple tiny 2-3 mm pulmonary nodules scattered throughout the
visualize lungs bilaterally, nonspecific. Within the visualized
portions of the thorax there are no suspicious appearing pulmonary
nodules or masses, there is no acute consolidative airspace disease,
no pleural effusions, no pneumothorax and no lymphadenopathy.
Visualized portions of the upper abdomen are unremarkable. There are
no aggressive appearing lytic or blastic lesions noted in the
visualized portions of the skeleton.
IMPRESSION: 1. Multiple small pulmonary nodules measuring 2-3 mm in size,
nonspecific, but statistically likely benign. No follow-up needed if
patient is low-risk (and has no known or suspected primary
neoplasm). Non-contrast chest CT can be considered in 12 months if
patient is high-risk. This recommendation follows the consensus
statement: Guidelines for Management of Incidental Pulmonary Nodules
Detected on CT Images: From the [HOSPITAL] 3777; Radiology
2.  Aortic Atherosclerosis ([id number]).

## 2023-05-16 DIAGNOSIS — L738 Other specified follicular disorders: Secondary | ICD-10-CM | POA: Diagnosis not present

## 2023-05-16 DIAGNOSIS — B079 Viral wart, unspecified: Secondary | ICD-10-CM | POA: Diagnosis not present

## 2023-05-16 DIAGNOSIS — Z85828 Personal history of other malignant neoplasm of skin: Secondary | ICD-10-CM | POA: Diagnosis not present

## 2023-05-16 DIAGNOSIS — D492 Neoplasm of unspecified behavior of bone, soft tissue, and skin: Secondary | ICD-10-CM | POA: Diagnosis not present

## 2023-05-16 DIAGNOSIS — L814 Other melanin hyperpigmentation: Secondary | ICD-10-CM | POA: Diagnosis not present

## 2023-05-16 DIAGNOSIS — L821 Other seborrheic keratosis: Secondary | ICD-10-CM | POA: Diagnosis not present

## 2023-05-16 DIAGNOSIS — D229 Melanocytic nevi, unspecified: Secondary | ICD-10-CM | POA: Diagnosis not present

## 2023-05-17 DIAGNOSIS — M25552 Pain in left hip: Secondary | ICD-10-CM | POA: Diagnosis not present

## 2023-05-22 DIAGNOSIS — M549 Dorsalgia, unspecified: Secondary | ICD-10-CM | POA: Diagnosis not present

## 2023-05-22 DIAGNOSIS — Z7409 Other reduced mobility: Secondary | ICD-10-CM | POA: Diagnosis not present

## 2023-08-29 DIAGNOSIS — M3501 Sicca syndrome with keratoconjunctivitis: Secondary | ICD-10-CM | POA: Diagnosis not present

## 2023-08-29 DIAGNOSIS — H01009 Unspecified blepharitis unspecified eye, unspecified eyelid: Secondary | ICD-10-CM | POA: Diagnosis not present

## 2023-08-29 DIAGNOSIS — H43813 Vitreous degeneration, bilateral: Secondary | ICD-10-CM | POA: Diagnosis not present

## 2023-08-29 DIAGNOSIS — H2513 Age-related nuclear cataract, bilateral: Secondary | ICD-10-CM | POA: Diagnosis not present

## 2023-08-29 DIAGNOSIS — E119 Type 2 diabetes mellitus without complications: Secondary | ICD-10-CM | POA: Diagnosis not present

## 2023-08-29 DIAGNOSIS — H02889 Meibomian gland dysfunction of unspecified eye, unspecified eyelid: Secondary | ICD-10-CM | POA: Diagnosis not present

## 2023-08-31 DIAGNOSIS — H02889 Meibomian gland dysfunction of unspecified eye, unspecified eyelid: Secondary | ICD-10-CM | POA: Diagnosis not present

## 2023-08-31 DIAGNOSIS — H2513 Age-related nuclear cataract, bilateral: Secondary | ICD-10-CM | POA: Diagnosis not present

## 2023-08-31 DIAGNOSIS — E119 Type 2 diabetes mellitus without complications: Secondary | ICD-10-CM | POA: Diagnosis not present

## 2023-08-31 DIAGNOSIS — H43813 Vitreous degeneration, bilateral: Secondary | ICD-10-CM | POA: Diagnosis not present

## 2023-08-31 DIAGNOSIS — M3501 Sicca syndrome with keratoconjunctivitis: Secondary | ICD-10-CM | POA: Diagnosis not present

## 2023-08-31 DIAGNOSIS — H01009 Unspecified blepharitis unspecified eye, unspecified eyelid: Secondary | ICD-10-CM | POA: Diagnosis not present

## 2023-09-07 DIAGNOSIS — Z23 Encounter for immunization: Secondary | ICD-10-CM | POA: Diagnosis not present

## 2023-09-07 DIAGNOSIS — S29012A Strain of muscle and tendon of back wall of thorax, initial encounter: Secondary | ICD-10-CM | POA: Diagnosis not present

## 2023-10-16 DIAGNOSIS — L84 Corns and callosities: Secondary | ICD-10-CM | POA: Diagnosis not present

## 2023-10-16 DIAGNOSIS — M216X1 Other acquired deformities of right foot: Secondary | ICD-10-CM | POA: Diagnosis not present

## 2023-10-16 DIAGNOSIS — M79672 Pain in left foot: Secondary | ICD-10-CM | POA: Diagnosis not present

## 2023-10-16 DIAGNOSIS — M216X2 Other acquired deformities of left foot: Secondary | ICD-10-CM | POA: Diagnosis not present

## 2023-10-16 DIAGNOSIS — D2372 Other benign neoplasm of skin of left lower limb, including hip: Secondary | ICD-10-CM | POA: Diagnosis not present

## 2023-11-08 DIAGNOSIS — M216X1 Other acquired deformities of right foot: Secondary | ICD-10-CM | POA: Diagnosis not present

## 2023-11-08 DIAGNOSIS — L851 Acquired keratosis [keratoderma] palmaris et plantaris: Secondary | ICD-10-CM | POA: Diagnosis not present

## 2023-11-08 DIAGNOSIS — M216X2 Other acquired deformities of left foot: Secondary | ICD-10-CM | POA: Diagnosis not present

## 2023-11-28 DIAGNOSIS — E119 Type 2 diabetes mellitus without complications: Secondary | ICD-10-CM | POA: Diagnosis not present

## 2023-11-28 DIAGNOSIS — J989 Respiratory disorder, unspecified: Secondary | ICD-10-CM | POA: Diagnosis not present

## 2023-11-28 DIAGNOSIS — E7800 Pure hypercholesterolemia, unspecified: Secondary | ICD-10-CM | POA: Diagnosis not present

## 2023-11-28 DIAGNOSIS — I700 Atherosclerosis of aorta: Secondary | ICD-10-CM | POA: Diagnosis not present

## 2023-11-28 DIAGNOSIS — N400 Enlarged prostate without lower urinary tract symptoms: Secondary | ICD-10-CM | POA: Diagnosis not present

## 2023-11-28 DIAGNOSIS — Z8546 Personal history of malignant neoplasm of prostate: Secondary | ICD-10-CM | POA: Diagnosis not present

## 2023-11-28 DIAGNOSIS — Z0000 Encounter for general adult medical examination without abnormal findings: Secondary | ICD-10-CM | POA: Diagnosis not present

## 2023-11-28 DIAGNOSIS — Z1331 Encounter for screening for depression: Secondary | ICD-10-CM | POA: Diagnosis not present

## 2023-12-05 DIAGNOSIS — Z125 Encounter for screening for malignant neoplasm of prostate: Secondary | ICD-10-CM | POA: Diagnosis not present

## 2023-12-06 DIAGNOSIS — C61 Malignant neoplasm of prostate: Secondary | ICD-10-CM | POA: Diagnosis not present
